# Patient Record
Sex: Female | Born: 1990 | Race: White | Hispanic: No | Marital: Married | Smoking: Never smoker
Health system: Southern US, Community
[De-identification: ages and names within clinical notes are randomized; demographics above are authoritative.]

## PROBLEM LIST (undated history)

## (undated) ENCOUNTER — Inpatient Hospital Stay: Payer: Self-pay

## (undated) DIAGNOSIS — N189 Chronic kidney disease, unspecified: Secondary | ICD-10-CM

## (undated) DIAGNOSIS — F419 Anxiety disorder, unspecified: Secondary | ICD-10-CM

## (undated) DIAGNOSIS — F32A Depression, unspecified: Secondary | ICD-10-CM

## (undated) HISTORY — DX: Anxiety disorder, unspecified: F41.9

## (undated) HISTORY — DX: Depression, unspecified: F32.A

---
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
## 2009-01-01 ENCOUNTER — Ambulatory Visit: Payer: Self-pay | Admitting: Obstetrics and Gynecology

## 2009-01-16 ENCOUNTER — Ambulatory Visit: Payer: Self-pay | Admitting: Family Medicine

## 2009-01-29 ENCOUNTER — Ambulatory Visit: Payer: Self-pay | Admitting: Family Medicine

## 2009-01-29 LAB — CONVERTED CEMR LAB
Hemoglobin: 11.3 g/dL — ABNORMAL LOW (ref 12.0–15.0)
RBC: 3.87 M/uL (ref 3.87–5.11)
RDW: 12.9 % (ref 11.5–15.5)
WBC: 12.6 10*3/uL — ABNORMAL HIGH (ref 4.0–10.5)

## 2009-01-30 ENCOUNTER — Inpatient Hospital Stay (HOSPITAL_COMMUNITY): Admission: AD | Admit: 2009-01-30 | Discharge: 2009-01-30 | Payer: Self-pay | Admitting: Obstetrics & Gynecology

## 2009-02-12 ENCOUNTER — Ambulatory Visit: Payer: Self-pay | Admitting: Obstetrics and Gynecology

## 2009-03-05 ENCOUNTER — Ambulatory Visit: Payer: Self-pay | Admitting: Obstetrics & Gynecology

## 2009-03-20 ENCOUNTER — Ambulatory Visit: Payer: Self-pay | Admitting: Obstetrics and Gynecology

## 2009-03-26 ENCOUNTER — Ambulatory Visit: Payer: Self-pay | Admitting: Obstetrics and Gynecology

## 2009-03-26 ENCOUNTER — Encounter: Payer: Self-pay | Admitting: Family Medicine

## 2009-03-26 LAB — CONVERTED CEMR LAB: Chlamydia, DNA Probe: NEGATIVE

## 2009-03-27 ENCOUNTER — Encounter: Payer: Self-pay | Admitting: Family Medicine

## 2009-04-02 ENCOUNTER — Ambulatory Visit: Payer: Self-pay | Admitting: Obstetrics & Gynecology

## 2009-04-09 ENCOUNTER — Ambulatory Visit: Payer: Self-pay | Admitting: Obstetrics and Gynecology

## 2009-04-15 ENCOUNTER — Inpatient Hospital Stay (HOSPITAL_COMMUNITY): Admission: AD | Admit: 2009-04-15 | Discharge: 2009-04-15 | Payer: Self-pay | Admitting: Obstetrics & Gynecology

## 2009-04-15 ENCOUNTER — Ambulatory Visit: Payer: Self-pay | Admitting: Advanced Practice Midwife

## 2009-04-16 ENCOUNTER — Ambulatory Visit: Payer: Self-pay | Admitting: Obstetrics and Gynecology

## 2009-04-22 ENCOUNTER — Encounter: Payer: Self-pay | Admitting: Family Medicine

## 2009-04-22 ENCOUNTER — Ambulatory Visit: Payer: Self-pay | Admitting: Obstetrics & Gynecology

## 2009-04-22 LAB — CONVERTED CEMR LAB
ALT: 17 units/L (ref 0–35)
Bilirubin, Direct: 0.1 mg/dL (ref 0.0–0.3)

## 2009-04-23 ENCOUNTER — Ambulatory Visit: Payer: Self-pay | Admitting: Obstetrics and Gynecology

## 2009-04-24 ENCOUNTER — Inpatient Hospital Stay (HOSPITAL_COMMUNITY): Admission: AD | Admit: 2009-04-24 | Discharge: 2009-04-27 | Payer: Self-pay | Admitting: Obstetrics & Gynecology

## 2009-04-24 ENCOUNTER — Ambulatory Visit: Payer: Self-pay | Admitting: Advanced Practice Midwife

## 2009-06-09 ENCOUNTER — Encounter: Payer: Self-pay | Admitting: Obstetrics and Gynecology

## 2009-06-09 ENCOUNTER — Other Ambulatory Visit: Admission: RE | Admit: 2009-06-09 | Discharge: 2009-06-09 | Payer: Self-pay | Admitting: Obstetrics and Gynecology

## 2009-06-09 ENCOUNTER — Ambulatory Visit: Payer: Self-pay | Admitting: Obstetrics and Gynecology

## 2009-07-21 ENCOUNTER — Ambulatory Visit: Payer: Self-pay | Admitting: Family Medicine

## 2010-03-06 ENCOUNTER — Emergency Department (HOSPITAL_COMMUNITY): Admission: EM | Admit: 2010-03-06 | Discharge: 2010-03-06 | Payer: Self-pay | Admitting: Adult Health

## 2011-01-31 LAB — COMPREHENSIVE METABOLIC PANEL
ALT: 20 U/L (ref 0–35)
Albumin: 2.3 g/dL — ABNORMAL LOW (ref 3.5–5.2)
Alkaline Phosphatase: 304 U/L — ABNORMAL HIGH (ref 39–117)
Potassium: 3.4 mEq/L — ABNORMAL LOW (ref 3.5–5.1)
Sodium: 136 mEq/L (ref 135–145)
Total Protein: 5.7 g/dL — ABNORMAL LOW (ref 6.0–8.3)

## 2011-01-31 LAB — RPR: RPR Ser Ql: NONREACTIVE

## 2011-01-31 LAB — CBC
MCV: 78.4 fL (ref 78.0–100.0)
RBC: 4.06 MIL/uL (ref 3.87–5.11)
WBC: 10.4 10*3/uL (ref 4.0–10.5)

## 2011-02-03 LAB — URINALYSIS, ROUTINE W REFLEX MICROSCOPIC
Bilirubin Urine: NEGATIVE
Glucose, UA: NEGATIVE mg/dL
Ketones, ur: NEGATIVE mg/dL
pH: 7 (ref 5.0–8.0)

## 2011-02-03 LAB — WET PREP, GENITAL

## 2011-02-03 LAB — URINE MICROSCOPIC-ADD ON

## 2011-02-08 LAB — URINALYSIS, ROUTINE W REFLEX MICROSCOPIC
Glucose, UA: NEGATIVE mg/dL
Ketones, ur: NEGATIVE mg/dL
Nitrite: NEGATIVE
Protein, ur: NEGATIVE mg/dL
Specific Gravity, Urine: 1.02 (ref 1.005–1.030)

## 2011-02-08 LAB — WET PREP, GENITAL
Clue Cells Wet Prep HPF POC: NONE SEEN
Trich, Wet Prep: NONE SEEN
Yeast Wet Prep HPF POC: NONE SEEN

## 2011-02-08 LAB — URINE CULTURE

## 2011-02-08 LAB — CBC
Hemoglobin: 12.7 g/dL (ref 12.0–15.0)
MCHC: 34.2 g/dL (ref 30.0–36.0)
MCV: 88 fL (ref 78.0–100.0)
RBC: 4.22 MIL/uL (ref 3.87–5.11)
RDW: 13 % (ref 11.5–15.5)

## 2011-02-08 LAB — URINE MICROSCOPIC-ADD ON

## 2011-02-09 LAB — URINE MICROSCOPIC-ADD ON

## 2011-02-09 LAB — URINALYSIS, ROUTINE W REFLEX MICROSCOPIC
Bilirubin Urine: NEGATIVE
Glucose, UA: NEGATIVE mg/dL
Ketones, ur: 15 mg/dL — AB
Protein, ur: NEGATIVE mg/dL

## 2011-03-09 NOTE — Group Therapy Note (Signed)
NAME:  PINNIX, [place]              ACCOUNT NO.:  [id number]   MEDICAL RECORD NO.:  [id number]          PATIENT TYPE:  POB   LOCATION:  [facility]                   FACILITY:  [facility]   PHYSICIAN:  Argentina Donovan, MD        DATE OF BIRTH:  1991/05/04   DATE OF SERVICE:  06/09/2009                                  CLINIC NOTE   The patient is a 20 year old Caucasian female, gravida 1, para 1-0-0-1,  who delivered by normal spontaneous delivery uneventfully at 8 pounds 2  ounces baby on April 25, 2009, i.e. 6 weeks ago, in for 6-week checkup.  Her main complaint is upper abdominal soreness like pressure, which she  has constantly, wakes up when she goes to bed with it and has this  continually all day and then when it she gets bad, it will radiate into  her back, does not seem to be in the pelvis.  She has no GI symptoms  whatsoever and I am not quite sure what this is.  In addition to that  she had bad acne vulgaris during her pregnancy and it has persisted.  She did get Depo-Provera in the hospital 6 weeks ago and was interested  in a Mirena IUD.  However, I have talked her about that considering that  the progesterone and one of possible side effects is causing acne.  I  think that she should be do better on the estrogen pill such as Yaz.  So  I am going to give her prescription of that.  I have told her that  because of the Depo-Provera, may take longer than several months before  she starts noticing any kind of improvement.   PHYSICAL EXAMINATION:  BREASTS:  Symmetrical, nontender.  No nipple  discharge.  No masses.  No supraclavicular or axillary nodes.  ABDOMEN:  Soft, flat, and nontender.  No masses or organomegaly.  EXTERNAL GENITALIA:  Normal.  BUS within normal limits.  Vagina is  clean, well rugated.  The cervix is clean with a marked ectropion at the  present time, probably not resolve delivery.  The uterus is retroverted  and of normal size, shape, consistency.  The adnexa is  normal.  No  edema.  No varicosities in the extremities.   The patient's postpartum blood pressure is 118/75 and pulse 69.  She is  139 pounds.  She is 5 feet and 4 inches tall.  I going to have the  patient come back in about 4 weeks to reevaluate this abdomen as the  pain is still there probably move on to an MRI and I really I am at a  loss to explain her pain that she is having.   IMPRESSION:  Postpartum examination.  Normal complaint of the upper  abdominal discomfort radiating into the back troubling.   PLAN:  Yaz for contraception.           ______________________________  Argentina Donovan, MD     PR/MEDQ  D:  06/09/2009  T:  06/10/2009  Job:  [id number]

## 2011-03-09 NOTE — Assessment & Plan Note (Signed)
NAME:  Brandi Montgomery, [place]              ACCOUNT NO.:  [id number]   MEDICAL RECORD NO.:  [id number]          PATIENT TYPE:  POB   LOCATION:  [facility]         FACILITY:  [facility]   PHYSICIAN:  Argentina Donovan, MD        DATE OF BIRTH:  1991/08/02   DATE OF SERVICE:  07/21/2009                                  CLINIC NOTE   The patient is an 20 year old, gravida 1, para 1-0-0-1, who was in short  time ago for her postpartum examination following the delivery in April 25, 2009, and wanted to try the birth control pill.  She was trying Yaz.  She has problems remembering them many of the others.  We have counseled  her against because of the severe acne vulgaris that the patient has.  In addition to that, she complained of some epigastric abdominal pain  occasionally, but it is gotten worse over this last month or so and she  has it every day and sometimes just bends her over is mainly in the mid  epigastric and right epigastric area.  Her mother has had a gallbladder  removed.  Her grandmother had 1 removed last week.  She says the pain  comes sometimes last for an hour to and this becomes much more severe,  does not seem to related to meals, eating, or if she can remember fat  intake.   On examination, her abdomen is soft, flat, nontender.  No masses or  organomegaly, although she does state that she is little uncomfortable  with push in the upper right quadrant, I cannot call as a real positive  Murphy sign.  In addition, after discussing the alternative  contraception as she had previously used NuvaRing with some success, we  are going to order that for her and she is going to start on that.  Her  Pap smear recently was normal.  We are going to get an ultrasound of her  gallbladder.  Should it be positive, we will refer her to a general  surgeon.  If it is negative, I think she needs a gastrointestinal  workup, so we will refer to gastroenterologist.   IMPRESSION:  Intermittent, but  severe epigastric and right upper  quadrant pain and contraception consultation.           ______________________________  Argentina Donovan, MD     PR/MEDQ  D:  07/21/2009  T:  07/22/2009  Job:  [id number]

## 2011-03-12 NOTE — Assessment & Plan Note (Signed)
[facility]                             [facility] OFFICE NOTE   NAME:PINNIX, [place] G                     MRN:          [id number]  DATE:08/19/2006                            DOB:          07-11-1991    [place] is a 20 year old white female who comes unaccompanied to establish  with the practice.  She indicates that she has had a sore throat with no  known exposure to strep.  She does have a history of bronchitis.  She has  had no fever or chills and has not missed school.   She also indicates at the end of the visit that she wanted to have a sports  physical completed.   She previously has been seen at [facility].   CURRENT MEDICATIONS:  Something for cramps - she does not know the name of  it, and a NuvaRing.   ALLERGIES:  CODEINE causes rash and shortness of breath.   PAST MEDICAL HISTORY:  Indicates she has frequent urinary tract infections,  has had chicken pox, no measles or mumps.  She has asthma with no recent  flare since the age of 66.  There is a family history of clotting problems  (thrombus).  She has had a workup which was negative.   SOCIAL HISTORY:  She is in the 10th grade at [employer].  She lives with her mom.  She indicates that she wants to be a fashion  designer.  She is single, has no children, and is a Biochemist, clinical.  She has  never smoked and does not use alcohol.   REVIEW OF SYSTEMS:  Basically negative including ENT, cardiovascular, GI.  She does indicate a history of headaches, especially recently, and asthma as  previously discussed.  She had HIV testing in 2006 which was negative.  GU:  She has no history of renal stones but does have a history of urinary tract  infections.  GYNECOLOGIC:  She is a nullipara.  Her last Pap smear was in  2006 by Marlinda Mike, C.N.M.  MUSCULOSKELETAL:  She has had fractures of her left ankle requiring surgery,  fracture of the left arm which was  casted, and a right ankle twice which  have both been casted.  She does have some occasional pain in her left  ankle.  She indicates allergic rhinitis in the spring and uses Claritin.   FAMILY HISTORY:  Father is living at age 7 with no known medical problems.  Mother is living at age 54.  She has had two strokes, has blood clotting  problems and hypertension.  She has one brother living and well.   When questioned regarding the extended family, I find that a maternal aunt  had a myocardial infarction about age 65.  There are blood clotting problems  on the mother's side of the family but no strokes.  There is hypertension on  the mother's side of the family.  Maternal grandmother has diabetes as well  as breast cancer.  To her knowledge, there is no ovarian, uterine, or  prostate cancer in the family.  A maternal uncle has died of colon cancer.  There is depression in a maternal great uncle.  To her knowledge, there is  no drug or alcohol abuse in the family.   IMMUNIZATION RECORD:  Her last tetanus was in 2006 and she has had the  hepatitis B vaccine.   PHYSICAL EXAMINATION:  VITAL SIGNS:  Blood pressure 96/67, temperature 97.9,  pulse is 72, weight is 115, height 5 feet 3-and-a-quarter inches.  GENERAL:  Well-developed, thin white female in no acute distress.  HEENT:  TMs retracted bilaterally with fluid present.  Mucosa is  erythematous and edematous.  Posterior pharynx slightly injected.  Facial  sinuses are not tender.  She has no lymphadenopathy in the neck.  CHEST:  Clear throughout.  No rales, rhonchi, or wheezes.  She has a slight  moist cough.  HEART:  Rate and rhythm regular without murmurs, gallops or rubs.  No  carotid bruits, no pretibial edema.  MUSCULOSKELETAL:  Muscle mass is equal throughout upper and lower  extremities.  Strength is 5/5 upper and lower extremities.  Spine is  straight without evidence of scoliosis.  She has full range of motion  throughout.   SKIN:  Without obvious lesions in the exposed areas.  PSYCHIATRIC:  Oriented x3.  Verbalizes easily, is quite pleasant.   ASSESSMENT:  1. Pharyngitis/upper respiratory infection.  Plan:  Will start her on      Clarinex 5 mg one daily, have given her samples as well as      prescription.  Will see her back if not improved.  2. Sports physical.  She came without a form.  This will need to be      completed at her earliest convenience.  She is up-to-date with her      tetanus and is aware of Gardasil and plans to get that.  We discussed      that she could either get it here or through her gynecologist.      Atha Starks. Bean, FNP  Electronically Signed     ______________________________  Arta Silence, MD   BDB/MedQ  DD: 08/26/2006  DT: 08/26/2006  Job #: [id number]

## 2011-03-18 ENCOUNTER — Emergency Department: Payer: Self-pay | Admitting: Emergency Medicine

## 2011-07-26 LAB — POCT URINALYSIS DIP (DEVICE)
Bilirubin Urine: NEGATIVE
Nitrite: NEGATIVE
Operator id: 235561
pH: 6

## 2011-07-26 LAB — POCT RAPID STREP A: Streptococcus, Group A Screen (Direct): NEGATIVE

## 2011-08-06 LAB — POCT URINALYSIS DIP (DEVICE)
Bilirubin Urine: NEGATIVE
Nitrite: NEGATIVE
Protein, ur: NEGATIVE
pH: 7.5

## 2011-11-11 ENCOUNTER — Encounter (HOSPITAL_COMMUNITY): Payer: Self-pay

## 2011-11-11 ENCOUNTER — Emergency Department (HOSPITAL_COMMUNITY)
Admission: EM | Admit: 2011-11-11 | Discharge: 2011-11-12 | Disposition: A | Discharge status: Home / Self Care | Payer: Self-pay | Attending: Emergency Medicine | Admitting: Emergency Medicine

## 2011-11-11 ENCOUNTER — Encounter (HOSPITAL_COMMUNITY): Payer: Self-pay | Admitting: Emergency Medicine

## 2011-11-11 ENCOUNTER — Emergency Department (INDEPENDENT_AMBULATORY_CARE_PROVIDER_SITE_OTHER)
Admission: EM | Admit: 2011-11-11 | Discharge: 2011-11-11 | Disposition: A | Discharge status: Nursing Home (Includes ICF) | Payer: Self-pay | Source: Home / Self Care | Attending: Emergency Medicine | Admitting: Emergency Medicine

## 2011-11-11 DIAGNOSIS — N10 Acute tubulo-interstitial nephritis: Secondary | ICD-10-CM

## 2011-11-11 DIAGNOSIS — O98919 Unspecified maternal infectious and parasitic disease complicating pregnancy, unspecified trimester: Secondary | ICD-10-CM

## 2011-11-11 DIAGNOSIS — N12 Tubulo-interstitial nephritis, not specified as acute or chronic: Secondary | ICD-10-CM | POA: Insufficient documentation

## 2011-11-11 DIAGNOSIS — R109 Unspecified abdominal pain: Secondary | ICD-10-CM | POA: Insufficient documentation

## 2011-11-11 DIAGNOSIS — R350 Frequency of micturition: Secondary | ICD-10-CM | POA: Insufficient documentation

## 2011-11-11 DIAGNOSIS — O2390 Unspecified genitourinary tract infection in pregnancy, unspecified trimester: Secondary | ICD-10-CM | POA: Insufficient documentation

## 2011-11-11 DIAGNOSIS — R42 Dizziness and giddiness: Secondary | ICD-10-CM | POA: Insufficient documentation

## 2011-11-11 DIAGNOSIS — IMO0002 Reserved for concepts with insufficient information to code with codable children: Secondary | ICD-10-CM

## 2011-11-11 DIAGNOSIS — R112 Nausea with vomiting, unspecified: Secondary | ICD-10-CM | POA: Insufficient documentation

## 2011-11-11 DIAGNOSIS — O2690 Pregnancy related conditions, unspecified, unspecified trimester: Secondary | ICD-10-CM

## 2011-11-11 DIAGNOSIS — N200 Calculus of kidney: Secondary | ICD-10-CM

## 2011-11-11 LAB — URINALYSIS, ROUTINE W REFLEX MICROSCOPIC
Protein, ur: 30 mg/dL — AB
Urobilinogen, UA: 1 mg/dL (ref 0.0–1.0)

## 2011-11-11 LAB — POCT URINALYSIS DIP (DEVICE)
Glucose, UA: NEGATIVE mg/dL
Nitrite: NEGATIVE
Urobilinogen, UA: 2 mg/dL — ABNORMAL HIGH (ref 0.0–1.0)
pH: 6.5 (ref 5.0–8.0)

## 2011-11-11 LAB — URINE MICROSCOPIC-ADD ON

## 2011-11-11 LAB — COMPREHENSIVE METABOLIC PANEL
BUN: 6 mg/dL (ref 6–23)
CO2: 19 mEq/L (ref 19–32)
Chloride: 103 mEq/L (ref 96–112)
Creatinine, Ser: 0.57 mg/dL (ref 0.50–1.10)
GFR calc non Af Amer: >90 mL/min (ref >90)
Glucose, Bld: 105 mg/dL — ABNORMAL HIGH (ref 70–99)
Total Bilirubin: 0.5 mg/dL (ref 0.3–1.2)

## 2011-11-11 LAB — DIFFERENTIAL
Lymphocytes Relative: 4 % — ABNORMAL LOW (ref 12–46)
Monocytes Absolute: 1.2 10*3/uL — ABNORMAL HIGH (ref 0.1–1.0)
Monocytes Relative: 8 % (ref 3–12)
Neutro Abs: 12.9 10*3/uL — ABNORMAL HIGH (ref 1.7–7.7)

## 2011-11-11 LAB — URINE CULTURE: Colony Count: >=100000

## 2011-11-11 LAB — CBC
HCT: 36.8 % (ref 36.0–46.0)
Hemoglobin: 13.1 g/dL (ref 12.0–15.0)
WBC: 14.7 10*3/uL — ABNORMAL HIGH (ref 4.0–10.5)

## 2011-11-11 LAB — LIPASE, BLOOD: Lipase: 22 U/L (ref 11–59)

## 2011-11-11 MED ORDER — SODIUM CHLORIDE 0.9 % IV BOLUS (SEPSIS)
1000.0000 mL | Freq: Once | INTRAVENOUS | Status: AC
  Administered 2011-11-11: 1000 mL via INTRAVENOUS

## 2011-11-11 MED ORDER — CEPHALEXIN 500 MG PO CAPS: 500.0000 mg | ORAL_CAPSULE | Freq: Three times a day (TID) | ORAL | Status: AC

## 2011-11-11 MED ORDER — DEXTROSE 5 % IV SOLN
1.0000 g | INTRAVENOUS | Status: DC
  Administered 2011-11-11: 1 g via INTRAVENOUS
  Filled 2011-11-11: qty 10

## 2011-11-11 MED ORDER — METOCLOPRAMIDE HCL 10 MG PO TABS: 10.0000 mg | ORAL_TABLET | Freq: Four times a day (QID) | ORAL | Status: AC

## 2011-11-11 MED ORDER — HYDROCODONE-ACETAMINOPHEN 5-325 MG PO TABS: 1.0000 | ORAL_TABLET | Freq: Four times a day (QID) | ORAL | Status: AC | PRN

## 2011-11-11 MED ORDER — ONDANSETRON HCL 4 MG/2ML IJ SOLN
4.0000 mg | Freq: Once | INTRAMUSCULAR | Status: AC
  Administered 2011-11-11: 4 mg via INTRAVENOUS

## 2011-11-11 MED ORDER — ONDANSETRON HCL 4 MG/2ML IJ SOLN
INTRAMUSCULAR | Status: AC
  Filled 2011-11-11: qty 2

## 2011-11-11 MED ORDER — MORPHINE SULFATE 4 MG/ML IJ SOLN
4.0000 mg | Freq: Once | INTRAMUSCULAR | Status: AC
  Administered 2011-11-11: 4 mg via INTRAVENOUS
  Filled 2011-11-11: qty 1

## 2011-11-11 MED ORDER — PRENATAL COMPLETE 14-0.4 MG PO TABS: 1.0000 | ORAL_TABLET | Freq: Every day | ORAL | Status: DC

## 2011-11-11 NOTE — ED Notes (Signed)
Pain in right lower back, onset 11/09/2010.  Urinary frequency -denies burning with urination.  Last kidney stone was in high school.  This pain feels worse.

## 2011-11-11 NOTE — ED Notes (Signed)
Patient approached nursing staff with complaints of seeing black spots and feeling like she would pass out.  Escorted back to room, continues with complaints

## 2011-11-11 NOTE — ED Provider Notes (Signed)
History     CSN: [id number]  Arrival date & time 11/11/11  1422   First MD Initiated Contact with Patient 11/11/11 1506      Chief Complaint  Patient presents with  . Back Pain    (Consider location/radiation/quality/duration/timing/severity/associated sxs/prior treatment) HPI Comments: Ms. Brandi Montgomery with sudden onset of right flank pain without radiation. The pain is worse if she sits up and better if she lies flat. It's otherwise severe and unremitting. It's been associated with nausea and vomiting all by mouth intake, she felt lightheaded, and dizzy. She's also had chills and sweats and urinary frequency. She denies any dysuria or hematuria. She has a history of kidney stones in 2009 which he passed spontaneously. She did see a urologist at that time. Denies any fever, diarrhea, or abdominal pain. Her last menstrual period was January 7. She denies any pregnancy symptoms.  Patient is a 21 y.o. female presenting with back pain.  Back Pain  Pertinent negatives include no fever, no abdominal pain, no dysuria and no pelvic pain.    Past Medical History  Diagnosis Date  . Kidney stone     History reviewed. No pertinent past surgical history.  History reviewed. No pertinent family history.  History  Substance Use Topics  . Smoking status: Never Smoker   . Smokeless tobacco: Not on file  . Alcohol Use: Yes    OB History    Grav Para Term Preterm Abortions TAB SAB Ect Mult Living                  Review of Systems  Constitutional: Positive for chills, diaphoresis and fatigue. Negative for fever.  Gastrointestinal: Negative for nausea, vomiting and abdominal pain.  Genitourinary: Positive for frequency and flank pain. Negative for dysuria, urgency, hematuria, vaginal bleeding, vaginal discharge, vaginal pain and pelvic pain.  Musculoskeletal: Positive for back pain.    Allergies  Codeine  Home Medications  No current outpatient prescriptions on file.  BP 114/78  Pulse  111  Temp(Src) 99.4 F (37.4 C) (Oral)  Resp 20  SpO2 98%  LMP 10/27/2011  Physical Exam  Nursing note and vitals reviewed. Constitutional: She appears well-developed and well-nourished. No distress.  Cardiovascular: Normal rate and regular rhythm.  Exam reveals no gallop and no friction rub.   No murmur heard. Pulmonary/Chest: Effort normal. No respiratory distress. She has no wheezes. She has no rales.  Abdominal: Soft. Bowel sounds are normal. She exhibits no distension and no mass. There is no hepatosplenomegaly. There is tenderness. There is no rebound, no guarding and no CVA tenderness.       She has severe right CVA tenderness to percussion. Abdomen is soft and flat. She has mild tenderness to palpation in the right flank without guarding or rebound. No organomegaly or mass. Bowel sounds are normal.  Skin: She is not diaphoretic.    ED Course  Procedures (including critical care time)  Results for orders placed during the hospital encounter of 11/11/11  POCT URINALYSIS DIP (DEVICE)      Component Value Range   Glucose, UA NEGATIVE  NEGATIVE (mg/dL)   Bilirubin Urine NEGATIVE  NEGATIVE    Ketones, ur 15 (*) NEGATIVE (mg/dL)   Specific Gravity, Urine 1.020  1.005 - 1.030    Hgb urine dipstick LARGE (*) NEGATIVE    pH 6.5  5.0 - 8.0    Protein, ur 100 (*) NEGATIVE (mg/dL)   Urobilinogen, UA 2.0 (*) 0.0 - 1.0 (mg/dL)   Nitrite NEGATIVE  NEGATIVE    Leukocytes, UA LARGE (*) NEGATIVE   POCT PREGNANCY, URINE      Component Value Range   Preg Test, Ur POSITIVE       Labs Reviewed  POCT URINALYSIS DIP (DEVICE) - Abnormal; Notable for the following:    Ketones, ur 15 (*)    Hgb urine dipstick LARGE (*)    Protein, ur 100 (*)    Urobilinogen, UA 2.0 (*)    Leukocytes, UA LARGE (*) Biochemical Testing Only. Please order routine urinalysis from main lab if confirmatory testing is needed.   All other components within normal limits  POCT PREGNANCY, URINE  POCT URINALYSIS  DIPSTICK  POCT PREGNANCY, URINE   No results found.   1. Acute pyelonephritis   2. Kidney stone   3. Pregnancy and infectious disease       MDM  She has probable acute pyelonephritis and possibly a kidney stone in the setting early pregnancy which is a very high risk and, therefore we will send her to the emergency department by shuttle.        Roque Lias, MD 11/11/11 1640

## 2011-11-11 NOTE — ED Provider Notes (Signed)
History     CSN: [id number]  Arrival date & time 11/11/11  1712   First MD Initiated Contact with Patient 11/11/11 1933      Chief Complaint  Patient presents with  . Flank Pain    (Consider location/radiation/quality/duration/timing/severity/associated sxs/prior treatment) HPI Patient presents to the emergency room with flank pain for the last 2 days. He has had trouble with urinary frequency.  Patient has mentioned in worsens with certain movement.  She has had some nausea vomiting. She's also had some lightheadedness and chills. Patient has history of kidney stones in the past. Denies any abdominal pain. She has not had any vaginal bleeding. Her last period was in the first week of January. The patient's pain is moderate to severe Past Medical History  Diagnosis Date  . Kidney stone   . Kidney stone     History reviewed. No pertinent past surgical history.  No family history on file.  History  Substance Use Topics  . Smoking status: Never Smoker   . Smokeless tobacco: Not on file  . Alcohol Use: Yes    OB History    Grav Para Term Preterm Abortions TAB SAB Ect Mult Living                  Review of Systems  Respiratory: Negative for cough.   Genitourinary: Positive for frequency and flank pain.  All other systems reviewed and are negative.    Allergies  Codeine  Home Medications  No current outpatient prescriptions on file.  BP 99/52  Pulse 106  Temp(Src) 97.9 F (36.6 C) (Oral)  Resp 16  SpO2 100%  LMP 10/27/2011  Physical Exam  Nursing note and vitals reviewed. Constitutional: She appears well-developed and well-nourished. No distress.  HENT:  Head: Normocephalic and atraumatic.  Right Ear: External ear normal.  Left Ear: External ear normal.  Eyes: Conjunctivae are normal. Right eye exhibits no discharge. Left eye exhibits no discharge. No scleral icterus.  Neck: Neck supple. No tracheal deviation present.  Cardiovascular: Normal rate,  regular rhythm and intact distal pulses.   Pulmonary/Chest: Effort normal and breath sounds normal. No stridor. No respiratory distress. She has no wheezes. She has no rales.  Abdominal: Soft. Bowel sounds are normal. She exhibits no distension and no mass. There is no tenderness. There is CVA tenderness (right-sided). There is no rebound and no guarding.  Musculoskeletal: She exhibits no edema and no tenderness.  Neurological: She is alert. She has normal strength. No sensory deficit. Cranial nerve deficit:  no gross defecits noted. She exhibits normal muscle tone. She displays no seizure activity. Coordination normal.  Skin: Skin is warm and dry. No rash noted.  Psychiatric: She has a normal mood and affect.    ED Course  Procedures (including critical care time)  Medications  cefTRIAXone (ROCEPHIN) 1 g in dextrose 5 % 50 mL IVPB (1 g Intravenous Given 11/11/11 2305)  morphine 4 MG/ML injection 4 mg (4 mg Intravenous Given 11/11/11 2046)  ondansetron (ZOFRAN) injection 4 mg (4 mg Intravenous Given 11/11/11 2046)  sodium chloride 0.9 % bolus 1,000 mL (1000 mL Intravenous Given 11/11/11 2214)  morphine 4 MG/ML injection 4 mg (4 mg Intravenous Given 11/11/11 2214)    Labs Reviewed  CBC - Abnormal; Notable for the following:    WBC 14.7 (*)    All other components within normal limits  DIFFERENTIAL - Abnormal; Notable for the following:    Neutrophils Relative 88 (*)    Neutro Abs  12.9 (*)    Lymphocytes Relative 4 (*)    Lymphs Abs 0.6 (*)    Monocytes Absolute 1.2 (*)    All other components within normal limits  COMPREHENSIVE METABOLIC PANEL - Abnormal; Notable for the following:    Glucose, Bld 105 (*)    Albumin 3.3 (*)    All other components within normal limits  URINALYSIS, ROUTINE W REFLEX MICROSCOPIC - Abnormal; Notable for the following:    APPearance CLOUDY (*)    Hgb urine dipstick MODERATE (*)    Ketones, ur >80 (*)    Protein, ur 30 (*)    Nitrite POSITIVE (*)     Leukocytes, UA LARGE (*)    All other components within normal limits  URINE MICROSCOPIC-ADD ON - Abnormal; Notable for the following:    Squamous Epithelial / LPF MANY (*)    Bacteria, UA MANY (*)    All other components within normal limits  LIPASE, BLOOD  URINE CULTURE  HCG, QUANTITATIVE, PREGNANCY   No results found.   Diagnosis: Pyelonephritis, pregnancy   MDM  Patient appears to have pyelonephritis. There does not appear to be any evidence to suggest an acute complication associated with her pregnancy.  Patient has not had any vaginal bleeding or abdominal pain. I doubt appendicitis based on her exam. Her urinalysis is consistent with a urinary tract infection and likely pyelonephritis considering her leukocytosis flank pain and the nausea and vomiting. Patient is improved with treatment here in the emergency department. I spoke with Dr.Lavoie feels it is reasonable to have her followup closely as an outpatient. Patient has been given a dose of IV Rocephin.        Celene Kras, MD 11/11/11 [phone]

## 2011-11-11 NOTE — ED Notes (Signed)
C/o nausea and vomiting, unable to hold solids or liquids down. no diarhea.  Patient reports chills during the night.

## 2011-11-11 NOTE — ED Notes (Signed)
PT sleeping

## 2011-11-11 NOTE — ED Notes (Signed)
Pt presents from urgent care with 2 day h/o R flank pain.  Pt diagnosed with kidney infection, referred here.

## 2011-11-14 NOTE — ED Notes (Signed)
Pt treated per protocol MD; Sensitive to same

## 2013-06-10 ENCOUNTER — Encounter (HOSPITAL_COMMUNITY): Payer: Self-pay | Admitting: *Deleted

## 2013-06-10 ENCOUNTER — Emergency Department (INDEPENDENT_AMBULATORY_CARE_PROVIDER_SITE_OTHER)
Admission: EM | Admit: 2013-06-10 | Discharge: 2013-06-10 | Disposition: A | Discharge status: Home / Self Care | Payer: Self-pay | Source: Home / Self Care

## 2013-06-10 DIAGNOSIS — N390 Urinary tract infection, site not specified: Secondary | ICD-10-CM

## 2013-06-10 LAB — POCT URINALYSIS DIP (DEVICE)
Ketones, ur: 15 mg/dL — AB
Nitrite: POSITIVE — AB
Specific Gravity, Urine: <=1.005 (ref 1.005–1.030)
pH: 5 (ref 5.0–8.0)

## 2013-06-10 MED ORDER — CEPHALEXIN 500 MG PO CAPS: 500.0000 mg | ORAL_CAPSULE | Freq: Three times a day (TID) | ORAL | Status: DC

## 2013-06-10 NOTE — ED Notes (Signed)
Started with urinary urgency 3 days ago; was drinking cranberry juice and taking AZO; woke up this morning with back pain.  Denies fevers.

## 2013-06-10 NOTE — ED Provider Notes (Signed)
Brandi Montgomery is a 22 y.o. female who presents to Urgent Care today for dysuria, urinary frequency and urgency occurring for the last several days. This morning she developed back pain and vomiting. She denies any fevers or chills. The symptoms are consistent with prior episodes of UTI. The pain is mild to moderate and does not radiate. She's had normal bowel movements. She has a history of kidney stones times one however the symptoms are not consistent with prior kidney stone. She has tried AZO which has helped a bit. She feels well otherwise.   PMH reviewed. One episode of kidney stone in the past. Urine culture from January 2013 shows pan sensitive Escherichia coli History  Substance Use Topics  . Smoking status: Never Smoker   . Smokeless tobacco: Not on file  . Alcohol Use: Yes     Comment: occasional   ROS as above Medications reviewed. No current facility-administered medications for this encounter.   Current Outpatient Prescriptions  Medication Sig Dispense Refill  . fish oil-omega-3 fatty acids 1000 MG capsule Take 2 g by mouth daily.      . cephALEXin (KEFLEX) 500 MG capsule Take 1 capsule (500 mg total) by mouth 3 (three) times daily.  21 capsule  0    Exam:  BP 123/88  Pulse 88  Temp(Src) 98.3 F (36.8 C) (Oral)  Resp 16  SpO2 98%  LMP 06/02/2013  Breastfeeding? Unknown Gen: Well NAD HEENT: EOMI,  MMM Lungs: CTABL Nl WOB Heart: RRR no MRG Abd: NABS, NT, ND, no CV angle tenderness to percussion Exts: Non edematous BL  LE, warm and well perfused.   Results for orders placed during the hospital encounter of 06/10/13 (from the past 24 hour(s))  POCT URINALYSIS DIP (DEVICE)     Status: Abnormal   Collection Time    06/10/13 12:39 PM      Result Value Range   Glucose, UA 250 (*) NEGATIVE mg/dL   Bilirubin Urine MODERATE (*) NEGATIVE   Ketones, ur 15 (*) NEGATIVE mg/dL   Specific Gravity, Urine <=1.005  1.005 - 1.030   Hgb urine dipstick TRACE (*) NEGATIVE   pH  5.0  5.0 - 8.0   Protein, ur >=300 (*) NEGATIVE mg/dL   Urobilinogen, UA >=7.8  0.0 - 1.0 mg/dL   Nitrite POSITIVE (*) NEGATIVE   Leukocytes, UA LARGE (*) NEGATIVE  POCT PREGNANCY, URINE     Status: None   Collection Time    06/10/13 12:40 PM      Result Value Range   Preg Test, Ur NEGATIVE  NEGATIVE   No results found.  Assessment and Plan: 22 y.o. female with UTI.  Plan to treat empirically with Keflex.  Followup as needed Handout provided Discussed warning signs or symptoms. Please see discharge instructions. Patient expresses understanding.      Rodolph Bong, MD 06/10/13 [phone]

## 2013-09-09 ENCOUNTER — Encounter (HOSPITAL_COMMUNITY): Payer: Self-pay | Admitting: Emergency Medicine

## 2013-09-09 ENCOUNTER — Emergency Department (INDEPENDENT_AMBULATORY_CARE_PROVIDER_SITE_OTHER)
Admission: EM | Admit: 2013-09-09 | Discharge: 2013-09-09 | Disposition: A | Discharge status: Home / Self Care | Payer: Self-pay | Source: Home / Self Care | Attending: Emergency Medicine | Admitting: Emergency Medicine

## 2013-09-09 DIAGNOSIS — J039 Acute tonsillitis, unspecified: Secondary | ICD-10-CM

## 2013-09-09 MED ORDER — AMOXICILLIN 500 MG PO CAPS: 500.0000 mg | ORAL_CAPSULE | Freq: Three times a day (TID) | ORAL | Status: DC

## 2013-09-09 MED ORDER — HYDROCODONE-ACETAMINOPHEN 5-325 MG PO TABS: ORAL_TABLET | ORAL | Status: DC

## 2013-09-09 NOTE — ED Provider Notes (Signed)
Chief Complaint:   Chief Complaint  Patient presents with  . Sore Throat    History of Present Illness:   Brandi Montgomery is a 22 year old female who's had a two-day history of severe sore throat, pain on swallowing, chills, temperature 100.4, sweats, and swollen, tender glands. She denies any headache, nasal congestion, rhinorrhea, cough, or GI symptoms. It hurts to open and close her mouth, but she has a full range of motion of the jaw joint. She has no difficulty swallowing food or liquids, handling her secretions, or breathing.  Review of Systems:  Other than as noted above, the patient denies any of the following symptoms. Systemic:  No fever, chills, sweats, fatigue, myalgias, headache, or anorexia. Eye:  No redness, pain or drainage. ENT:  No earache, ear congestion, nasal congestion, sneezing, rhinorrhea, sinus pressure, sinus pain, or post nasal drip. Lungs:  No cough, sputum production, wheezing, shortness of breath, or chest pain. GI:  No abdominal pain, nausea, vomiting, or diarrhea. Skin:  No rash or itching.  PMFSH:  Past medical history, family history, social history, meds, allergies, and nurse's notes were reviewed.  There is no known exposure to strep or mono.  No prior history of step or mono.  The patient denies use of tobacco.   Physical Exam:   Vital signs:  BP 105/64  Pulse 80  Temp(Src) 98.2 F (36.8 C) (Oral)  Resp 16  SpO2 100%  LMP 09/02/2013 General:  Alert, in no distress. Eye:  No conjunctival injection or drainage. Lids were normal. ENT:  TMs and canals were normal, without erythema or inflammation.  Nasal mucosa was clear and uncongested, without drainage.  Mucous membranes were moist.  Exam of pharynx reveals tonsils to be markedly enlarged right more so than left. The uvula is midline, she has minimal bulging of the right anterior tonsillar pillar.  There were no oral ulcerations or lesions.  Neck:  Supple, no adenopathy, tenderness or mass. Lungs:  No  respiratory distress.  Lungs were clear to auscultation, without wheezes, rales or rhonchi.  Breath sounds were clear and equal bilaterally.  Heart:  Regular rhythm, without gallops, murmers or rubs. Skin:  Clear, warm, and dry, without rash or lesions.  Labs:   Results for orders placed during the hospital encounter of 09/09/13  POCT RAPID STREP A ([facility] ONLY)      Result Value Range   Streptococcus, Group A Screen (Direct) NEGATIVE  NEGATIVE  POCT INFECTIOUS MONO SCREEN      Result Value Range   Mono Screen NEGATIVE  NEGATIVE   Assessment:  The encounter diagnosis was Tonsillitis.  There is no evidence of a peritonsillar abscess.  I think she has some peritonsillar cellulitis. I called Dr. Suzanna Obey, the ear nose and throat specialist on call for today and discuss this with him. He is willing to see her in the office first thing tomorrow morning. In the meantime will treat with amoxicillin and she was given hydrocodone for pain.  Plan:   1.  Meds:  The following meds were prescribed:   Discharge Medication List as of 09/09/2013  2:57 PM    START taking these medications   Details  amoxicillin (AMOXIL) 500 MG capsule Take 1 capsule (500 mg total) by mouth 3 (three) times daily., Starting 09/09/2013, Until Discontinued, Normal    HYDROcodone-acetaminophen (NORCO/VICODIN) 5-325 MG per tablet 1 to 2 tabs every 4 to 6 hours as needed for pain., Print  2.  Patient Education/Counseling:  The patient was given appropriate handouts, self care instructions, and instructed in symptomatic relief, including hot saline gargles, throat lozenges, infectious precautions, and need to trade out toothbrush.   3.  Follow up:  The patient was told to follow up if no better in 3 to 4 days, if becoming worse in any way, and given some red flag symptoms such as difficulty swallowing or breathing which would prompt immediate return.  Follow up at the emergency department as needed for worsening  symptoms, and with Dr. Jearld Fenton tomorrow morning.     Reuben Likes, MD 09/09/13 2000

## 2013-09-09 NOTE — ED Notes (Signed)
Assessment per Dr. Keller. 

## 2013-09-11 LAB — CULTURE, GROUP A STREP

## 2013-12-04 ENCOUNTER — Ambulatory Visit (INDEPENDENT_AMBULATORY_CARE_PROVIDER_SITE_OTHER): Payer: 59 | Admitting: Family Medicine

## 2013-12-04 ENCOUNTER — Encounter: Payer: Self-pay | Admitting: Family Medicine

## 2013-12-04 VITALS — BP 110/68 | HR 62 | Temp 99.2°F | Resp 16 | Ht 64.0 in | Wt 128.2 lb

## 2013-12-04 DIAGNOSIS — Z113 Encounter for screening for infections with a predominantly sexual mode of transmission: Secondary | ICD-10-CM

## 2013-12-04 DIAGNOSIS — Z13220 Encounter for screening for lipoid disorders: Secondary | ICD-10-CM | POA: Insufficient documentation

## 2013-12-04 DIAGNOSIS — Z0000 Encounter for general adult medical examination without abnormal findings: Secondary | ICD-10-CM | POA: Insufficient documentation

## 2013-12-04 DIAGNOSIS — N760 Acute vaginitis: Secondary | ICD-10-CM

## 2013-12-04 DIAGNOSIS — Z01419 Encounter for gynecological examination (general) (routine) without abnormal findings: Secondary | ICD-10-CM

## 2013-12-04 LAB — POCT UA - MICROSCOPIC ONLY
CASTS, UR, LPF, POC: NEGATIVE
Crystals, Ur, HPF, POC: NEGATIVE
YEAST UA: NEGATIVE

## 2013-12-04 LAB — POCT URINALYSIS DIPSTICK
Bilirubin, UA: NEGATIVE
Glucose, UA: NEGATIVE
Ketones, UA: NEGATIVE
NITRITE UA: NEGATIVE
PH UA: 6.5
PROTEIN UA: NEGATIVE
Spec Grav, UA: 1.02
UROBILINOGEN UA: 0.2

## 2013-12-04 LAB — LIPID PANEL
Cholesterol: 150 mg/dL (ref 0–200)
HDL: 52 mg/dL (ref >39)
LDL Cholesterol: 87 mg/dL (ref 0–99)
TRIGLYCERIDES: 57 mg/dL (ref <150)
Total CHOL/HDL Ratio: 2.9 Ratio
VLDL: 11 mg/dL (ref 0–40)

## 2013-12-04 LAB — COMPLETE METABOLIC PANEL WITH GFR
ALK PHOS: 51 U/L (ref 39–117)
ALT: 13 U/L (ref 0–35)
AST: 15 U/L (ref 0–37)
Albumin: 4.2 g/dL (ref 3.5–5.2)
BILIRUBIN TOTAL: 0.4 mg/dL (ref 0.2–1.2)
BUN: 10 mg/dL (ref 6–23)
CALCIUM: 9.2 mg/dL (ref 8.4–10.5)
CHLORIDE: 106 meq/L (ref 96–112)
CO2: 26 mEq/L (ref 19–32)
CREATININE: 0.75 mg/dL (ref 0.50–1.10)
GFR, Est African American: >89 mL/min
GFR, Est Non African American: >89 mL/min
Glucose, Bld: 81 mg/dL (ref 70–99)
Potassium: 4.4 mEq/L (ref 3.5–5.3)
Sodium: 137 mEq/L (ref 135–145)
Total Protein: 7.1 g/dL (ref 6.0–8.3)

## 2013-12-04 LAB — POCT WET PREP WITH KOH
KOH PREP POC: POSITIVE
Trichomonas, UA: NEGATIVE

## 2013-12-04 LAB — CBC WITH DIFFERENTIAL/PLATELET
Basophils Absolute: 0.1 10*3/uL (ref 0.0–0.1)
Basophils Relative: 1 % (ref 0–1)
Eosinophils Absolute: 0.2 10*3/uL (ref 0.0–0.7)
Eosinophils Relative: 3 % (ref 0–5)
HEMATOCRIT: 40.5 % (ref 36.0–46.0)
Hemoglobin: 13.8 g/dL (ref 12.0–15.0)
LYMPHS ABS: 1.9 10*3/uL (ref 0.7–4.0)
LYMPHS PCT: 31 % (ref 12–46)
MCH: 29.7 pg (ref 26.0–34.0)
MCHC: 34.1 g/dL (ref 30.0–36.0)
MCV: 87.1 fL (ref 78.0–100.0)
MONO ABS: 0.5 10*3/uL (ref 0.1–1.0)
Monocytes Relative: 8 % (ref 3–12)
NEUTROS ABS: 3.4 10*3/uL (ref 1.7–7.7)
Neutrophils Relative %: 57 % (ref 43–77)
Platelets: 208 10*3/uL (ref 150–400)
RBC: 4.65 MIL/uL (ref 3.87–5.11)
RDW: 13.3 % (ref 11.5–15.5)
WBC: 6 10*3/uL (ref 4.0–10.5)

## 2013-12-04 MED ORDER — METRONIDAZOLE 0.75 % VA GEL: 1.0000 | Freq: Two times a day (BID) | VAGINAL | Status: DC

## 2013-12-04 MED ORDER — FLUCONAZOLE 150 MG PO TABS: 150.0000 mg | ORAL_TABLET | Freq: Once | ORAL | Status: DC

## 2013-12-04 NOTE — Progress Notes (Signed)
Subjective:    Patient ID: Brandi Montgomery, female    DOB: 08/15/1991, 23 y.o.   MRN: [id number]  HPI  This 23 y.o. Cauc female is here for CPE/ PAP. Last PAP (negative) >4 years ago. Pt takes no prescription medications and uses condoms for contraception. She has some minor allergy symptoms but is otherwise healthy. She is G1P1; healthy 24 yr-old son born 04/25/2009.  PMHx, Surg Hx, Soc and Fam Hx reviewed.  Review of Systems  HENT: Positive for sinus pressure.   Eyes: Positive for itching.  All other systems reviewed and are negative.       Objective:   Physical Exam  Nursing note and vitals reviewed. Constitutional: She is oriented to person, place, and time. Vital signs are normal. She appears well-developed and well-nourished. No distress.  HENT:  Head: Normocephalic and atraumatic.  Right Ear: Hearing, tympanic membrane, external ear and ear canal normal.  Left Ear: Hearing, tympanic membrane, external ear and ear canal normal.  Nose: Nose normal. No mucosal edema, rhinorrhea, nasal deformity or septal deviation.  Mouth/Throat: Uvula is midline, oropharynx is clear and moist and mucous membranes are normal. No oral lesions. Normal dentition. No dental caries.  Eyes: Conjunctivae, EOM and lids are normal. Pupils are equal, round, and reactive to light. No scleral icterus.  Fundoscopic exam:      The right eye shows no arteriolar narrowing, no AV nicking and no papilledema. The right eye shows red reflex.       The left eye shows no arteriolar narrowing, no AV nicking and no papilledema. The left eye shows red reflex.  Neck: Trachea normal, normal range of motion and full passive range of motion without pain. Neck supple. No spinous process tenderness and no muscular tenderness present. No mass and no thyromegaly present.  Cardiovascular: Normal rate, regular rhythm, S1 normal, S2 normal, normal heart sounds, intact distal pulses and normal pulses.   No extrasystoles are present.  PMI is not displaced.  Exam reveals no gallop and no friction rub.   No murmur heard. Pulmonary/Chest: Effort normal and breath sounds normal. No respiratory distress. Right breast exhibits no inverted nipple, no mass, no nipple discharge, no skin change and no tenderness. Left breast exhibits no inverted nipple, no mass, no nipple discharge, no skin change and no tenderness. Breasts are symmetrical.  Abdominal: Soft. Normal appearance and bowel sounds are normal. She exhibits no distension and no mass. There is no hepatosplenomegaly. There is no tenderness. There is no guarding and no CVA tenderness.  Genitourinary: Rectum normal and uterus normal. There is no rash, tenderness or lesion on the right labia. There is no rash, tenderness or lesion on the left labia. Uterus is not deviated, not enlarged, not fixed and not tender. Cervix exhibits discharge and friability. Cervix exhibits no motion tenderness. Right adnexum displays no mass, no tenderness and no fullness. Left adnexum displays no mass, no tenderness and no fullness. No erythema, tenderness or bleeding around the vagina. No foreign body around the vagina. No signs of injury around the vagina. Vaginal discharge found.  Musculoskeletal: Normal range of motion. She exhibits no edema and no tenderness.  Lymphadenopathy:       Head (right side): No submental, no submandibular, no tonsillar, no posterior auricular and no occipital adenopathy present.       Head (left side): No submental, no submandibular, no tonsillar, no posterior auricular and no occipital adenopathy present.    She has no cervical adenopathy.  She has no axillary adenopathy.       Right: No inguinal and no supraclavicular adenopathy present.       Left: No inguinal and no supraclavicular adenopathy present.  Neurological: She is alert and oriented to person, place, and time. She has normal strength. She displays no atrophy and no tremor. No cranial nerve deficit or sensory  deficit. She exhibits normal muscle tone. Coordination and gait normal.  Skin: Skin is warm, dry and intact. No ecchymosis, no lesion, no petechiae and no rash noted. She is not diaphoretic. No cyanosis or erythema. No pallor. Nails show no clubbing.  Psychiatric: She has a normal mood and affect. Her speech is normal and behavior is normal. Judgment and thought content normal. Cognition and memory are normal.    Results for orders placed in visit on 12/04/13  POCT URINALYSIS DIPSTICK      Result Value Range   Color, UA yellow     Clarity, UA cloudy     Glucose, UA neg     Bilirubin, UA neg     Ketones, UA neg     Spec Grav, UA 1.020     Blood, UA trace-intact     pH, UA 6.5     Protein, UA neg     Urobilinogen, UA 0.2     Nitrite, UA neg     Leukocytes, UA small (1+)    POCT WET PREP WITH KOH      Result Value Range   Trichomonas, UA Negative     Clue Cells Wet Prep HPF POC 3-8     Epithelial Wet Prep HPF POC 12-21     Yeast Wet Prep HPF POC buds     Bacteria Wet Prep HPF POC 3+     RBC Wet Prep HPF POC 0-6     WBC Wet Prep HPF POC 12-24     KOH Prep POC Positive    POCT UA - MICROSCOPIC ONLY      Result Value Range   WBC, Ur, HPF, POC 3-8     RBC, urine, microscopic 0-3     Bacteria, U Microscopic 1+     Mucus, UA small     Epithelial cells, urine per micros 3-7     Crystals, Ur, HPF, POC neg     Casts, Ur, LPF, POC neg     Yeast, UA neg         Assessment & Plan:  Routine general medical examination at a health care facility - Plan: POCT urinalysis dipstick, COMPLETE METABOLIC PANEL WITH GFR, CBC with Differential, Lipid panel, POCT UA - Microscopic Only  Encounter for cervical Pap smear with pelvic exam - Plan: Pap IG, CT/NG w/ reflex HPV when ASC-U, POCT Wet Prep with KOH  Vaginitis and vulvovaginitis, unspecified  Screening examination for venereal disease - Plan: HIV antibody  Advised about Gardisil vaccine which she can receive before age 33; pt to find out  if she has had Tdap vaccine.  Meds ordered this encounter  Medications  . fluconazole (DIFLUCAN) 150 MG tablet    Sig: Take 1 tablet (150 mg total) by mouth once.    Dispense:  1 tablet    Refill:  0  . metroNIDAZOLE (METROGEL VAGINAL) 0.75 % vaginal gel    Sig: Place 1 Applicatorful vaginally 2 (two) times daily.    Dispense:  70 g    Refill:  0

## 2013-12-04 NOTE — Patient Instructions (Addendum)
Keeping You Healthy  Get These Tests 1. Blood Pressure- Have your blood pressure checked once a year by your health care provider.  Normal blood pressure is 120/80. 2. Weight- Have your body mass index (BMI) calculated to screen for obesity.  BMI is measure of body fat based on height and weight.  You can also calculate your own BMI at https://www.west-esparza.com/. 3. Cholesterol- Have your cholesterol checked every 5 years starting at age 24 then yearly starting at age 71. 4. Chlamydia, HIV, and other sexually transmitted diseases- Get screened every year until age 71, then within three months of each new sexual provider. 5. Pap Smear- Every 1-3 years; discuss with your health care provider. 6. Mammogram- Every year starting at age 69  Take these medicines  Calcium with Vitamin D-Your body needs 1200 mg of Calcium each day and [phone] IU of Vitamin D daily.  Your body can only absorb 500 mg of Calcium at a time so Calcium must be taken in 2 or 3 divided doses throughout the day.  Multivitamin with folic acid- Once daily if it is possible for you to become pregnant.  Get these Immunizations  Gardasil-Series of three doses; prevents HPV related illness such as genital warts and cervical cancer.  You will be provided with some information about this vaccine. You have until age 33 to get all 3 doses.  Menactra-Single dose; prevents meningitis.  Tetanus shot- Every 10 years. You need to find out if you have received Tdap vaccine which should have been given durinig your last pregnancy if you had not received it within the last 8-10 years.  Flu shot-Every year.  Take these steps 1. Do not smoke-Your healthcare provider can help you quit.  For tips on how to quit go to www.smokefree.gov or call 1-800 QUITNOW. 2. Be physically active- Exercise 5 days a week for at least 30 minutes.  If you are not already physically active, start slow and gradually work up to 30 minutes of moderate physical  activity.  Examples of moderate activity include walking briskly, dancing, swimming, bicycling, etc. 3. Breast Cancer- A self breast exam every month is important for early detection of breast cancer.  For more information and instruction on self breast exams, ask your healthcare provider or [email or web address].[email or web address]. 4. Eat a healthy diet- Eat a variety of healthy foods such as fruits, vegetables, whole grains, low fat milk, low fat cheeses, yogurt, lean meats, poultry and fish, beans, nuts, tofu, etc.  For more information go to www. Thenutritionsource.org 5. Drink alcohol in moderation- Limit alcohol intake to one drink or less per day. Never drink and drive. 6. Depression- Your emotional health is as important as your physical health.  If you're feeling down or losing interest in things you normally enjoy please talk to your healthcare provider about being screened for depression. 7. Dental visit- Brush and floss your teeth twice daily; visit your dentist twice a year. 8. Eye doctor- Get an eye exam at least every 2 years. 9. Helmet use- Always wear a helmet when riding a bicycle, motorcycle, rollerblading or skateboarding. 10. Safe sex- If you may be exposed to sexually transmitted infections, use a condom. 11. Seat belts- Seat belts can save your live; always wear one. 12. Smoke/Carbon Monoxide detectors- These detectors need to be installed on the appropriate level of your home. Replace batteries at least once a year. 13. Skin cancer- When out in the sun please cover up and use sunscreen 15 SPF or higher. 14. Violence-  If anyone is threatening or hurting you, please tell your healthcare provider.     Human Papillomavirus (HPV) Gardasil Vaccine What You Need to Know WHAT IS HPV?  Genital human papillomavirus (HPV) is the most common sexually transmitted virus in the [place]. More than half of sexually active men and women are infected with HPV at some time in  their lives.  About 20 million Americans are currently infected, and about 6 million more get infected each year. HPV is usually spread through sexual contact.  Most HPV infections do not cause any symptoms and go away on their own. But HPV can cause cervical cancer in women. Cervical cancer is the 2nd leading cause of cancer deaths among women around the world. In the [place], about 12,000 women get cervical cancer every year and about 4,000 are expected to die from it.  HPV is also associated with several less common cancers, such as vaginal and vulvar cancers in women, and anal and oropharyngeal (back of the throat, including base of tongue and tonsils) cancers in both men and women. HPV can also cause genital warts and warts in the throat.  There is no cure for HPV infection, but some of the problems it causes can be treated. HPV VACCINE: WHY GET VACCINATED?  The HPV vaccine you are getting is 1 of 2 vaccines that can be given to prevent HPV. It may be given to both males and females.  This vaccine can prevent most cases of cervical cancer in females, if it is given before exposure to the virus. In addition, it can prevent vaginal and vulvar cancer in females, and genital warts and anal cancer in both males and females.  Protection from HPV vaccine is expected to be long-lasting. But vaccination is not a substitute for cervical cancer screening. Women should still get regular Pap tests. WHO SHOULD GET THIS HPV VACCINE AND WHEN? HPV vaccine is given as a 3-dose series.  1st Dose: Now.  2nd Dose: 1 to 2 months after Dose 1.  3rd Dose: 6 months after Dose 1. Additional (booster) doses are not recommended. Routine Vaccination This HPV vaccine is recommended for girls and boys 34 or 24 years of age. It may be given starting at age 4. Why is HPV vaccine recommended at 58 or 24 years of age?  HPV infection is easily acquired, even with only one sex partner. That is why it is important  to get HPV vaccine before any sexual contact takes place. Also, response to the vaccine is better at this age than at older ages. Catch-Up Vaccination This vaccine is recommended for the following people who have not completed the 3-dose series:   Females 13 through 24 years of age.  Males 13 through 24 years of age. This vaccine may be given to men 22 through 24 years of age who have not completed the 3-dose series. It is recommended for men through age 40 who have sex with men or whose immune system is weakened because of HIV infection, other illness, or medications.  HPV vaccine may be given at the same time as other vaccines. SOME PEOPLE SHOULD NOT GET HPV VACCINE OR SHOULD WAIT  Anyone who has ever had a life-threatening allergic reaction to any other component of HPV vaccine, or to a previous dose of HPV vaccine, should not get the vaccine. Tell your doctor if the person getting vaccinated has any severe allergies, including an allergy to yeast.  HPV vaccine is not recommended for  pregnant women. However, receiving HPV vaccine when pregnant is not a reason to consider terminating the pregnancy. Women who are breastfeeding may get the vaccine.  People who are mildly ill when a dose of HPV is planned can still be vaccinated. People with a moderate or severe illness should wait until they are better. WHAT ARE THE RISKS FROM THIS VACCINE?  This HPV vaccine has been used in the [place] and around the world for about 6 years and has been very safe.  However, any medicine could possibly cause a serious problem, such as a severe allergic reaction. The risk of any vaccine causing a serious injury, or death, is extremely small.  Life-threatening allergic reactions from vaccines are very rare. If they do occur, it would be within a few minutes to a few hours after the vaccination. Several mild to moderate problems are known to occur with HPV vaccine. These do not last long and go away on their  own.  Reactions in the arm where the shot was given:  Pain (about 8 people in 10).  Redness or swelling (about 1 person in 4).  Fever:  Mild (100 F or 37.8 C) (about 1 person in 10).  Moderate (102 F or 38.9 C) (about 1 person in 62).  Other problems:  Headache (about 1 person in 3).  Fainting: Brief fainting spells and related symptoms (such as jerking movements) can happen after any medical procedure, including vaccination. Sitting or lying down for about 15 minutes after a vaccination can help prevent fainting and injuries caused by falls. Tell your doctor if the patient feels dizzy or lightheaded, or has vision changes or ringing in the ears.  Like all vaccines, HPV vaccines will continue to be monitored for unusual or severe problems. WHAT IF THERE IS A SERIOUS REACTION? What should I look for?  Any unusual condition, such as a high fever or unusual behavior. Signs of a serious allergic reaction can include difficulty breathing, hoarseness or wheezing, hives, paleness, weakness, a fast heartbeat, or dizziness. What should I do?  Call a doctor, or get the person to a doctor right away.  Tell your doctor what happened, the date and time it happened, and when the vaccination was given.  Ask your doctor, nurse, or health department to report the reaction by filing a Vaccine Adverse Event Reporting System (VAERS) form. Or, you can file this report through the VAERS website at www.vaers.LAgents.no or by calling 1-205-466-8187. VAERS does not provide medical advice. THE NATIONAL VACCINE INJURY COMPENSATION PROGRAM  The National Vaccine Injury Compensation Program (VICP) is a federal program that was created to compensate people who may have been injured by certain vaccines.  Persons who believe they may have been injured by a vaccine can learn about the program and about filing a claim by calling 1-769 049 5361 or visiting the VICP website at SpiritualWord.at HOW  CAN I LEARN MORE?  Ask your doctor.  Call your local or state health department.  Contact the Centers for Disease Control and Prevention (CDC):  Call 986-336-4527 (1-800-CDC-INFO)  or  Visit CDC's website at PicCapture.uy CDC Human Papillomavirus (HPV) Gardasil (Interim) 03/10/12 Document Released: 08/08/2006 Document Revised: 08/01/2013 Document Reviewed: 01/30/2013 [facility] Patient Information 2014 [place], [place].    Bacterial Vaginosis Bacterial vaginosis is an infection of the vagina. It happens when too many of certain germs (bacteria) grow in the vagina. HOME CARE  Take your medicine as told by your doctor.  Finish your medicine even if you start to feel  better.  Do not have sex until you finish your medicine and are better.  Tell your sex partner that you have an infection. They should see their doctor for treatment.  Practice safe sex. Use condoms. Have only one sex partner. GET HELP IF:  You are not getting better after 3 days of treatment.  You have more grey fluid (discharge) coming from your vagina than before.  You have more pain than before.  You have a fever. MAKE SURE YOU:   Understand these instructions.  Will watch your condition.  Will get help right away if you are not doing well or get worse. Document Released: 07/20/2008 Document Revised: 08/01/2013 Document Reviewed: 05/23/2013 [facility] Patient Information 2014 [place], [place].

## 2013-12-05 LAB — PAP IG, CT-NG, RFX HPV ASCU
CHLAMYDIA PROBE AMP: NEGATIVE
GC PROBE AMP: NEGATIVE

## 2013-12-05 LAB — HIV ANTIBODY (ROUTINE TESTING W REFLEX): HIV: NONREACTIVE

## 2013-12-07 NOTE — Progress Notes (Signed)
Quick Note:  Please advise pt regarding following labs...   Pap is negative /normal and all labs are normal. HIV test is non-reactive/negative.  Copy to pt. ______

## 2014-03-02 ENCOUNTER — Emergency Department: Payer: Self-pay | Admitting: Emergency Medicine

## 2014-03-02 LAB — CBC WITH DIFFERENTIAL/PLATELET
BASOS ABS: 0 10*3/uL (ref 0.0–0.1)
Basophil %: 0.2 %
Eosinophil #: 0 10*3/uL (ref 0.0–0.7)
Eosinophil %: 0.1 %
HCT: 41.6 % (ref 35.0–47.0)
HGB: 13.9 g/dL (ref 12.0–16.0)
Lymphocyte #: 0.8 10*3/uL — ABNORMAL LOW (ref 1.0–3.6)
Lymphocyte %: 8.5 %
MCH: 30 pg (ref 26.0–34.0)
MCHC: 33.4 g/dL (ref 32.0–36.0)
MCV: 90 fL (ref 80–100)
MONO ABS: 1.6 x10 3/mm — AB (ref 0.2–0.9)
MONOS PCT: 16.3 %
NEUTROS ABS: 7.3 10*3/uL — AB (ref 1.4–6.5)
Neutrophil %: 74.9 %
Platelet: 149 10*3/uL — ABNORMAL LOW (ref 150–440)
RBC: 4.64 10*6/uL (ref 3.80–5.20)
RDW: 13.2 % (ref 11.5–14.5)
WBC: 9.8 10*3/uL (ref 3.6–11.0)

## 2014-03-02 LAB — URINALYSIS, COMPLETE
BILIRUBIN, UR: NEGATIVE
Glucose,UR: NEGATIVE mg/dL (ref 0–75)
Nitrite: POSITIVE
Ph: 5 (ref 4.5–8.0)
Protein: 30
Specific Gravity: 1.017 (ref 1.003–1.030)
Squamous Epithelial: 13
WBC UR: 24 /HPF (ref 0–5)

## 2014-03-02 LAB — COMPREHENSIVE METABOLIC PANEL
ALK PHOS: 67 U/L
ALT: 20 U/L (ref 12–78)
Albumin: 3.7 g/dL (ref 3.4–5.0)
Anion Gap: 9 (ref 7–16)
BILIRUBIN TOTAL: 0.5 mg/dL (ref 0.2–1.0)
BUN: 8 mg/dL (ref 7–18)
CALCIUM: 9.3 mg/dL (ref 8.5–10.1)
CHLORIDE: 99 mmol/L (ref 98–107)
CO2: 26 mmol/L (ref 21–32)
Creatinine: 0.74 mg/dL (ref 0.60–1.30)
GLUCOSE: 84 mg/dL (ref 65–99)
OSMOLALITY: 266 (ref 275–301)
POTASSIUM: 3.2 mmol/L — AB (ref 3.5–5.1)
SGOT(AST): 12 U/L — ABNORMAL LOW (ref 15–37)
Sodium: 134 mmol/L — ABNORMAL LOW (ref 136–145)
TOTAL PROTEIN: 8.4 g/dL — AB (ref 6.4–8.2)

## 2014-03-02 LAB — TSH: THYROID STIMULATING HORM: 1.18 u[IU]/mL

## 2014-08-26 ENCOUNTER — Encounter: Payer: Self-pay | Admitting: Family Medicine

## 2016-04-29 ENCOUNTER — Encounter: Payer: Self-pay | Admitting: Obstetrics & Gynecology

## 2016-04-29 ENCOUNTER — Ambulatory Visit (INDEPENDENT_AMBULATORY_CARE_PROVIDER_SITE_OTHER): Payer: Self-pay | Admitting: Obstetrics & Gynecology

## 2016-04-29 VITALS — BP 118/78 | HR 80 | Wt 107.0 lb

## 2016-04-29 DIAGNOSIS — Z3481 Encounter for supervision of other normal pregnancy, first trimester: Secondary | ICD-10-CM

## 2016-04-29 DIAGNOSIS — Z36 Encounter for antenatal screening of mother: Secondary | ICD-10-CM

## 2016-04-29 DIAGNOSIS — Z3491 Encounter for supervision of normal pregnancy, unspecified, first trimester: Secondary | ICD-10-CM

## 2016-04-29 NOTE — Progress Notes (Signed)
Pt here for initial prenatal appt. She has pending insurance. Initial physical exam, labs and Pap at next visit.  Bedside US shows single IUP measuring 3453w5d CRL and FHR 175

## 2016-04-29 NOTE — Progress Notes (Addendum)
Subjective:  Brandi Montgomery is a 25 y.o. G2P1001 at 4046w1d. Pt here for initial prenatal appt. She has pending insurance. Initial physical exam, labs and Pap at next visit.  Bedside US shows single IUP measuring 7426w5d CRL and FHR 175 Patient reports nausea.  Contractions: Not present.  Vag. Bleeding: None. Movement: Absent. Denies leaking of fluid.   The following portions of the patient's history were reviewed and updated as appropriate: allergies, current medications, past family history, past medical history, past social history, past surgical history and problem list.   Objective:   Filed Vitals:   04/29/16 1025  BP: 118/78  Pulse: 80  Weight: 107 lb (48.535 kg)    Fetal Status: Fetal Heart Rate (bpm): 175   Movement: Absent      Assessment and Plan:  Pregnancy: G2P1001 at 7346w1d  1. Supervision of normal pregnancy, first trimester Labor symptoms and general obstetric precautions including but not limited to vaginal bleeding, contractions, leaking of fluid and fetal movement were reviewed in detail with the patient. Please refer to After Visit Summary for other counseling recommendations.  Return in about 2 weeks (around 05/13/2016) for New OB.   Arne ClevelandMandy J Hutchinson, CMA

## 2016-05-13 ENCOUNTER — Encounter: Payer: Self-pay | Admitting: Obstetrics & Gynecology

## 2016-05-27 ENCOUNTER — Encounter: Payer: Self-pay | Admitting: Obstetrics & Gynecology

## 2016-07-01 ENCOUNTER — Ambulatory Visit (INDEPENDENT_AMBULATORY_CARE_PROVIDER_SITE_OTHER): Payer: Self-pay | Admitting: Family Medicine

## 2016-07-01 ENCOUNTER — Encounter: Payer: Self-pay | Admitting: Family Medicine

## 2016-07-01 DIAGNOSIS — Z3492 Encounter for supervision of normal pregnancy, unspecified, second trimester: Secondary | ICD-10-CM

## 2016-07-01 DIAGNOSIS — Z3490 Encounter for supervision of normal pregnancy, unspecified, unspecified trimester: Secondary | ICD-10-CM | POA: Insufficient documentation

## 2016-07-01 NOTE — Patient Instructions (Signed)
Second Trimester of Pregnancy The second trimester is from week 13 through week 28, months 4 through 6. The second trimester is often a time when you feel your best. Your body has also adjusted to being pregnant, and you begin to feel better physically. Usually, morning sickness has lessened or quit completely, you may have more energy, and you may have an increase in appetite. The second trimester is also a time when the fetus is growing rapidly. At the end of the sixth month, the fetus is about 9 inches long and weighs about 1 pounds. You will likely begin to feel the baby move (quickening) between 18 and 20 weeks of the pregnancy. BODY CHANGES Your body goes through many changes during pregnancy. The changes vary from woman to woman.   Your weight will continue to increase. You will notice your lower abdomen bulging out.  You may begin to get stretch marks on your hips, abdomen, and breasts.  You may develop headaches that can be relieved by medicines approved by your health care provider.  You may urinate more often because the fetus is pressing on your bladder.  You may develop or continue to have heartburn as a result of your pregnancy.  You may develop constipation because certain hormones are causing the muscles that push waste through your intestines to slow down.  You may develop hemorrhoids or swollen, bulging veins (varicose veins).  You may have back pain because of the weight gain and pregnancy hormones relaxing your joints between the bones in your pelvis and as a result of a shift in weight and the muscles that support your balance.  Your breasts will continue to grow and be tender.  Your gums may bleed and may be sensitive to brushing and flossing.  Dark spots or blotches (chloasma, mask of pregnancy) may develop on your face. This will likely fade after the baby is born.  A dark line from your belly button to the pubic area (linea nigra) may appear. This will likely  fade after the baby is born.  You may have changes in your hair. These can include thickening of your hair, rapid growth, and changes in texture. Some women also have hair loss during or after pregnancy, or hair that feels dry or thin. Your hair will most likely return to normal after your baby is born. WHAT TO EXPECT AT YOUR PRENATAL VISITS During a routine prenatal visit:  You will be weighed to make sure you and the fetus are growing normally.  Your blood pressure will be taken.  Your abdomen will be measured to track your baby's growth.  The fetal heartbeat will be listened to.  Any test results from the previous visit will be discussed. Your health care provider may ask you:  How you are feeling.  If you are feeling the baby move.  If you have had any abnormal symptoms, such as leaking fluid, bleeding, severe headaches, or abdominal cramping.  If you are using any tobacco products, including cigarettes, chewing tobacco, and electronic cigarettes.  If you have any questions. Other tests that may be performed during your second trimester include:  Blood tests that check for:  Low iron levels (anemia).  Gestational diabetes (between 24 and 28 weeks).  Rh antibodies.  Urine tests to check for infections, diabetes, or protein in the urine.  An ultrasound to confirm the proper growth and development of the baby.  An amniocentesis to check for possible genetic problems.  Fetal screens for spina bifida   and Down syndrome.  HIV (human immunodeficiency virus) testing. Routine prenatal testing includes screening for HIV, unless you choose not to have this test. HOME CARE INSTRUCTIONS   Avoid all smoking, herbs, alcohol, and unprescribed drugs. These chemicals affect the formation and growth of the baby.  Do not use any tobacco products, including cigarettes, chewing tobacco, and electronic cigarettes. If you need help quitting, ask your health care provider. You may receive  counseling support and other resources to help you quit.  Follow your health care provider's instructions regarding medicine use. There are medicines that are either safe or unsafe to take during pregnancy.  Exercise only as directed by your health care provider. Experiencing uterine cramps is a good sign to stop exercising.  Continue to eat regular, healthy meals.  Wear a good support bra for breast tenderness.  Do not use hot tubs, steam rooms, or saunas.  Wear your seat belt at all times when driving.  Avoid raw meat, uncooked cheese, cat litter boxes, and soil used by cats. These carry germs that can cause birth defects in the baby.  Take your prenatal vitamins.  Take 1500-2000 mg of calcium daily starting at the 20th week of pregnancy until you deliver your baby.  Try taking a stool softener (if your health care provider approves) if you develop constipation. Eat more high-fiber foods, such as fresh vegetables or fruit and whole grains. Drink plenty of fluids to keep your urine clear or pale yellow.  Take warm sitz baths to soothe any pain or discomfort caused by hemorrhoids. Use hemorrhoid cream if your health care provider approves.  If you develop varicose veins, wear support hose. Elevate your feet for 15 minutes, 3-4 times a day. Limit salt in your diet.  Avoid heavy lifting, wear low heel shoes, and practice good posture.  Rest with your legs elevated if you have leg cramps or low back pain.  Visit your dentist if you have not gone yet during your pregnancy. Use a soft toothbrush to brush your teeth and be gentle when you floss.  A sexual relationship may be continued unless your health care provider directs you otherwise.  Continue to go to all your prenatal visits as directed by your health care provider. SEEK MEDICAL CARE IF:   You have dizziness.  You have mild pelvic cramps, pelvic pressure, or nagging pain in the abdominal area.  You have persistent nausea,  vomiting, or diarrhea.  You have a bad smelling vaginal discharge.  You have pain with urination. SEEK IMMEDIATE MEDICAL CARE IF:   You have a fever.  You are leaking fluid from your vagina.  You have spotting or bleeding from your vagina.  You have severe abdominal cramping or pain.  You have rapid weight gain or loss.  You have shortness of breath with chest pain.  You notice sudden or extreme swelling of your face, hands, ankles, feet, or legs.  You have not felt your baby move in over an hour.  You have severe headaches that do not go away with medicine.  You have vision changes.   This information is not intended to replace advice given to you by your health care provider. Make sure you discuss any questions you have with your health care provider.   Document Released: 10/05/2001 Document Revised: 11/01/2014 Document Reviewed: 12/12/2012 Elsevier Interactive Patient Education 2016 [employer]   Breastfeeding Deciding to breastfeed is one of the best choices you can make for you and your baby. A change   in hormones during pregnancy causes your breast tissue to grow and increases the number and size of your milk ducts. These hormones also allow proteins, sugars, and fats from your blood supply to make breast milk in your milk-producing glands. Hormones prevent breast milk from being released before your baby is born as well as prompt milk flow after birth. Once breastfeeding has begun, thoughts of your baby, as well as his or her sucking or crying, can stimulate the release of milk from your milk-producing glands.  BENEFITS OF BREASTFEEDING For Your Baby  Your first milk (colostrum) helps your baby's digestive system function better.  There are antibodies in your milk that help your baby fight off infections.  Your baby has a lower incidence of asthma, allergies, and sudden infant death syndrome.  The nutrients in breast milk are better for your baby than infant  formulas and are designed uniquely for your baby's needs.  Breast milk improves your baby's brain development.  Your baby is less likely to develop other conditions, such as childhood obesity, asthma, or type 2 diabetes mellitus. For You  Breastfeeding helps to create a very special bond between you and your baby.  Breastfeeding is convenient. Breast milk is always available at the correct temperature and costs nothing.  Breastfeeding helps to burn calories and helps you lose the weight gained during pregnancy.  Breastfeeding makes your uterus contract to its prepregnancy size faster and slows bleeding (lochia) after you give birth.   Breastfeeding helps to lower your risk of developing type 2 diabetes mellitus, osteoporosis, and breast or ovarian cancer later in life. SIGNS THAT YOUR BABY IS HUNGRY Early Signs of Hunger  Increased alertness or activity.  Stretching.  Movement of the head from side to side.  Movement of the head and opening of the mouth when the corner of the mouth or cheek is stroked (rooting).  Increased sucking sounds, smacking lips, cooing, sighing, or squeaking.  Hand-to-mouth movements.  Increased sucking of fingers or hands. Late Signs of Hunger  Fussing.  Intermittent crying. Extreme Signs of Hunger Signs of extreme hunger will require calming and consoling before your baby will be able to breastfeed successfully. Do not wait for the following signs of extreme hunger to occur before you initiate breastfeeding:  Restlessness.  A loud, strong cry.  Screaming. BREASTFEEDING BASICS Breastfeeding Initiation  Find a comfortable place to sit or lie down, with your neck and back well supported.  Place a pillow or rolled up blanket under your baby to bring him or her to the level of your breast (if you are seated). Nursing pillows are specially designed to help support your arms and your baby while you breastfeed.  Make sure that your baby's  abdomen is facing your abdomen.  Gently massage your breast. With your fingertips, massage from your chest wall toward your nipple in a circular motion. This encourages milk flow. You may need to continue this action during the feeding if your milk flows slowly.  Support your breast with 4 fingers underneath and your thumb above your nipple. Make sure your fingers are well away from your nipple and your baby's mouth.  Stroke your baby's lips gently with your finger or nipple.  When your baby's mouth is open wide enough, quickly bring your baby to your breast, placing your entire nipple and as much of the colored area around your nipple (areola) as possible into your baby's mouth.  More areola should be visible above your baby's upper lip than   below the lower lip.  Your baby's tongue should be between his or her lower gum and your breast.  Ensure that your baby's mouth is correctly positioned around your nipple (latched). Your baby's lips should create a seal on your breast and be turned out (everted).  It is common for your baby to suck about 2-3 minutes in order to start the flow of breast milk. Latching Teaching your baby how to latch on to your breast properly is very important. An improper latch can cause nipple pain and decreased milk supply for you and poor weight gain in your baby. Also, if your baby is not latched onto your nipple properly, he or she may swallow some air during feeding. This can make your baby fussy. Burping your baby when you switch breasts during the feeding can help to get rid of the air. However, teaching your baby to latch on properly is still the best way to prevent fussiness from swallowing air while breastfeeding. Signs that your baby has successfully latched on to your nipple:  Silent tugging or silent sucking, without causing you pain.  Swallowing heard between every 3-4 sucks.  Muscle movement above and in front of his or her ears while sucking. Signs  that your baby has not successfully latched on to nipple:  Sucking sounds or smacking sounds from your baby while breastfeeding.  Nipple pain. If you think your baby has not latched on correctly, slip your finger into the corner of your baby's mouth to break the suction and place it between your baby's gums. Attempt breastfeeding initiation again. Signs of Successful Breastfeeding Signs from your baby:  A gradual decrease in the number of sucks or complete cessation of sucking.  Falling asleep.  Relaxation of his or her body.  Retention of a small amount of milk in his or her mouth.  Letting go of your breast by himself or herself. Signs from you:  Breasts that have increased in firmness, weight, and size 1-3 hours after feeding.  Breasts that are softer immediately after breastfeeding.  Increased milk volume, as well as a change in milk consistency and color by the fifth day of breastfeeding.  Nipples that are not sore, cracked, or bleeding. Signs That Your Baby is Getting Enough Milk  Wetting at least 3 diapers in a 24-hour period. The urine should be clear and pale yellow by age 5 days.  At least 3 stools in a 24-hour period by age 5 days. The stool should be soft and yellow.  At least 3 stools in a 24-hour period by age 7 days. The stool should be seedy and yellow.  No loss of weight greater than 10% of birth weight during the first 3 days of age.  Average weight gain of 4-7 ounces (113-198 g) per week after age 4 days.  Consistent daily weight gain by age 5 days, without weight loss after the age of 2 weeks. After a feeding, your baby may spit up a small amount. This is common. BREASTFEEDING FREQUENCY AND DURATION Frequent feeding will help you make more milk and can prevent sore nipples and breast engorgement. Breastfeed when you feel the need to reduce the fullness of your breasts or when your baby shows signs of hunger. This is called "breastfeeding on demand." Avoid  introducing a pacifier to your baby while you are working to establish breastfeeding (the first 4-6 weeks after your baby is born). After this time you may choose to use a pacifier. Research has shown that   pacifier use during the first year of a baby's life decreases the risk of sudden infant death syndrome (SIDS). Allow your baby to feed on each breast as long as he or she wants. Breastfeed until your baby is finished feeding. When your baby unlatches or falls asleep while feeding from the first breast, offer the second breast. Because newborns are often sleepy in the first few weeks of life, you may need to awaken your baby to get him or her to feed. Breastfeeding times will vary from baby to baby. However, the following rules can serve as a guide to help you ensure that your baby is properly fed:  Newborns (babies 4 weeks of age or younger) may breastfeed every 1-3 hours.  Newborns should not go longer than 3 hours during the day or 5 hours during the night without breastfeeding.  You should breastfeed your baby a minimum of 8 times in a 24-hour period until you begin to introduce solid foods to your baby at around 6 months of age. BREAST MILK PUMPING Pumping and storing breast milk allows you to ensure that your baby is exclusively fed your breast milk, even at times when you are unable to breastfeed. This is especially important if you are going back to work while you are still breastfeeding or when you are not able to be present during feedings. Your lactation consultant can give you guidelines on how long it is safe to store breast milk. A breast pump is a machine that allows you to pump milk from your breast into a sterile bottle. The pumped breast milk can then be stored in a refrigerator or freezer. Some breast pumps are operated by hand, while others use electricity. Ask your lactation consultant which type will work best for you. Breast pumps can be purchased, but some hospitals and  breastfeeding support groups lease breast pumps on a monthly basis. A lactation consultant can teach you how to hand express breast milk, if you prefer not to use a pump. CARING FOR YOUR BREASTS WHILE YOU BREASTFEED Nipples can become dry, cracked, and sore while breastfeeding. The following recommendations can help keep your breasts moisturized and healthy:  Avoid using soap on your nipples.  Wear a supportive bra. Although not required, special nursing bras and tank tops are designed to allow access to your breasts for breastfeeding without taking off your entire bra or top. Avoid wearing underwire-style bras or extremely tight bras.  Air dry your nipples for 3-4minutes after each feeding.  Use only cotton bra pads to absorb leaked breast milk. Leaking of breast milk between feedings is normal.  Use lanolin on your nipples after breastfeeding. Lanolin helps to maintain your skin's normal moisture barrier. If you use pure lanolin, you do not need to wash it off before feeding your baby again. Pure lanolin is not toxic to your baby. You may also hand express a few drops of breast milk and gently massage that milk into your nipples and allow the milk to air dry. In the first few weeks after giving birth, some women experience extremely full breasts (engorgement). Engorgement can make your breasts feel heavy, warm, and tender to the touch. Engorgement peaks within 3-5 days after you give birth. The following recommendations can help ease engorgement:  Completely empty your breasts while breastfeeding or pumping. You may want to start by applying warm, moist heat (in the shower or with warm water-soaked hand towels) just before feeding or pumping. This increases circulation and helps the milk   flow. If your baby does not completely empty your breasts while breastfeeding, pump any extra milk after he or she is finished.  Wear a snug bra (nursing or regular) or tank top for 1-2 days to signal your body  to slightly decrease milk production.  Apply ice packs to your breasts, unless this is too uncomfortable for you.  Make sure that your baby is latched on and positioned properly while breastfeeding. If engorgement persists after 48 hours of following these recommendations, contact your health care provider or a lactation consultant. OVERALL HEALTH CARE RECOMMENDATIONS WHILE BREASTFEEDING  Eat healthy foods. Alternate between meals and snacks, eating 3 of each per day. Because what you eat affects your breast milk, some of the foods may make your baby more irritable than usual. Avoid eating these foods if you are sure that they are negatively affecting your baby.  Drink milk, fruit juice, and water to satisfy your thirst (about 10 glasses a day).  Rest often, relax, and continue to take your prenatal vitamins to prevent fatigue, stress, and anemia.  Continue breast self-awareness checks.  Avoid chewing and smoking tobacco. Chemicals from cigarettes that pass into breast milk and exposure to secondhand smoke may harm your baby.  Avoid alcohol and drug use, including marijuana. Some medicines that may be harmful to your baby can pass through breast milk. It is important to ask your health care provider before taking any medicine, including all over-the-counter and prescription medicine as well as vitamin and herbal supplements. It is possible to become pregnant while breastfeeding. If birth control is desired, ask your health care provider about options that will be safe for your baby. SEEK MEDICAL CARE IF:  You feel like you want to stop breastfeeding or have become frustrated with breastfeeding.  You have painful breasts or nipples.  Your nipples are cracked or bleeding.  Your breasts are red, tender, or warm.  You have a swollen area on either breast.  You have a fever or chills.  You have nausea or vomiting.  You have drainage other than breast milk from your nipples.  Your  breasts do not become full before feedings by the fifth day after you give birth.  You feel sad and depressed.  Your baby is too sleepy to eat well.  Your baby is having trouble sleeping.   Your baby is wetting less than 3 diapers in a 24-hour period.  Your baby has less than 3 stools in a 24-hour period.  Your baby's skin or the white part of his or her eyes becomes yellow.   Your baby is not gaining weight by 5 days of age. SEEK IMMEDIATE MEDICAL CARE IF:  Your baby is overly tired (lethargic) and does not want to wake up and feed.  Your baby develops an unexplained fever.   This information is not intended to replace advice given to you by your health care provider. Make sure you discuss any questions you have with your health care provider.   Document Released: 10/11/2005 Document Revised: 07/02/2015 Document Reviewed: 04/04/2013 Elsevier Interactive Patient Education 2016 [employer]  

## 2016-07-02 NOTE — Progress Notes (Signed)
   PRENATAL VISIT NOTE  Subjective:  Brandi Montgomery is a 25 y.o. G2P1001 at 2775w2d being seen today for ongoing prenatal care.  She is currently monitored for the following issues for this low-risk pregnancy and has Supervision of normal pregnancy on her problem list.  Patient reports no complaints.  Contractions: Not present. Vag. Bleeding: None.  Movement: Present. Denies leaking of fluid.   The following portions of the patient's history were reviewed and updated as appropriate: allergies, current medications, past family history, past medical history, past social history, past surgical history and problem list. Problem list updated.  Objective:   Vitals:   07/01/16 1000  BP: 113/73  Pulse: 89  Weight: 123 lb (55.8 kg)    Fetal Status: Fetal Heart Rate (bpm): 142   Movement: Present     General:  Alert, oriented and cooperative. Patient is in no acute distress.  Skin: Skin is warm and dry. No rash noted.   Cardiovascular: Normal heart rate noted  Respiratory: Normal respiratory effort, no problems with respiration noted  Abdomen: Soft, gravid, appropriate for gestational age. Pain/Pressure: Absent     Pelvic:  Cervical exam deferred        Extremities: Normal range of motion.  Edema: None  Mental Status: Normal mood and affect. Normal behavior. Normal judgment and thought content.   Urinalysis:      Assessment and Plan:  Pregnancy: G2P1001 at 5275w2d  1. Supervision of normal pregnancy, second trimester Still without insurance--u/s at the [facility] once she has insurance. - US OB Comp + 14 Wk; Future  Preterm labor symptoms and general obstetric precautions including but not limited to vaginal bleeding, contractions, leaking of fluid and fetal movement were reviewed in detail with the patient. Please refer to After Visit Summary for other counseling recommendations.  Return in 4 weeks (on 07/29/2016).  Reva Boresanya S Margean Korell, MD

## 2016-07-30 ENCOUNTER — Encounter: Payer: Self-pay | Admitting: Obstetrics & Gynecology

## 2016-09-01 ENCOUNTER — Other Ambulatory Visit: Payer: Self-pay | Admitting: *Deleted

## 2016-09-01 ENCOUNTER — Encounter: Payer: Self-pay | Admitting: *Deleted

## 2016-09-01 ENCOUNTER — Ambulatory Visit (INDEPENDENT_AMBULATORY_CARE_PROVIDER_SITE_OTHER): Payer: Self-pay | Admitting: Obstetrics and Gynecology

## 2016-09-01 VITALS — BP 112/74 | HR 86 | Wt 140.0 lb

## 2016-09-01 DIAGNOSIS — Z3490 Encounter for supervision of normal pregnancy, unspecified, unspecified trimester: Secondary | ICD-10-CM

## 2016-09-01 DIAGNOSIS — Z23 Encounter for immunization: Secondary | ICD-10-CM

## 2016-09-01 DIAGNOSIS — Z3492 Encounter for supervision of normal pregnancy, unspecified, second trimester: Secondary | ICD-10-CM

## 2016-09-01 DIAGNOSIS — Z3493 Encounter for supervision of normal pregnancy, unspecified, third trimester: Secondary | ICD-10-CM

## 2016-09-01 NOTE — Progress Notes (Signed)
Rou

## 2016-09-01 NOTE — Progress Notes (Signed)
   PRENATAL VISIT NOTE  Subjective:  Brandi Montgomery is a 25 y.o. G2P1001 at 6562w0d being seen today for ongoing prenatal care.  She is currently monitored for the following issues for this low-risk pregnancy and has Supervision of normal pregnancy on her problem list.  Patient reports no complaints.  Contractions: Not present. Vag. Bleeding: None.  Movement: Present. Denies leaking of fluid.   The following portions of the patient's history were reviewed and updated as appropriate: allergies, current medications, past family history, past medical history, past social history, past surgical history and problem list. Problem list updated.  Objective:   Vitals:   09/01/16 0900  BP: 112/74  Pulse: 86  Weight: 140 lb (63.5 kg)    Fetal Status: Fetal Heart Rate (bpm): 146 Fundal Height: 28 cm Movement: Present     General:  Alert, oriented and cooperative. Patient is in no acute distress.  Skin: Skin is warm and dry. No rash noted.   Cardiovascular: Normal heart rate noted  Respiratory: Normal respiratory effort, no problems with respiration noted  Abdomen: Soft, gravid, appropriate for gestational age. Pain/Pressure: Absent     Pelvic:  Cervical exam deferred        Extremities: Normal range of motion.  Edema: Trace  Mental Status: Normal mood and affect. Normal behavior. Normal judgment and thought content.   Assessment and Plan:  Pregnancy: G2P1001 at 2062w0d  1. Encounter for supervision of normal pregnancy, antepartum, unspecified gravidity Patient is doing well without complaints [facility]- records waiting to be faxed over 3rd trimester labs today  Patient agrees to Tdap and Flu vaccine - Glucose Tolerance, 2 Hours w/1 Hour - CBC - HIV antibody - RPR - Tdap vaccine greater than or equal to 7yo IM - Flu Vaccine QUAD 36+ mos IM (Fluarix, Quad PF)  Preterm labor symptoms and general obstetric precautions  including but not limited to vaginal bleeding, contractions, leaking of fluid and fetal movement were reviewed in detail with the patient. Please refer to After Visit Summary for other counseling recommendations.  No Follow-up on file.   Catalina AntiguaPeggy Dontea Corlew, MD

## 2016-09-02 LAB — CBC

## 2016-09-02 LAB — GLUCOSE TOLERANCE, 2 HOURS W/ 1HR
GLUCOSE, 2 HOUR: 102 mg/dL (ref <140)
Glucose, 1 hour: 113 mg/dL
Glucose, Fasting: 74 mg/dL (ref 65–99)

## 2016-09-02 LAB — HIV ANTIBODY (ROUTINE TESTING W REFLEX): HIV: NONREACTIVE

## 2016-09-02 LAB — RPR

## 2016-09-22 ENCOUNTER — Encounter: Payer: Self-pay | Admitting: Obstetrics and Gynecology

## 2016-09-27 ENCOUNTER — Ambulatory Visit (INDEPENDENT_AMBULATORY_CARE_PROVIDER_SITE_OTHER): Payer: BLUE CROSS/BLUE SHIELD | Admitting: Obstetrics & Gynecology

## 2016-09-27 VITALS — BP 114/76 | HR 108 | Wt 148.0 lb

## 2016-09-27 DIAGNOSIS — Z3483 Encounter for supervision of other normal pregnancy, third trimester: Secondary | ICD-10-CM

## 2016-09-27 NOTE — Patient Instructions (Signed)
Return to clinic for any scheduled appointments or obstetric concerns, or go to [facility]U for evaluation  

## 2016-09-27 NOTE — Progress Notes (Signed)
   PRENATAL VISIT NOTE  Subjective:  [place] G Pinnix is a 25 y.o. G2P1001 at 5359w5d being seen today for ongoing prenatal care.  She is currently monitored for the following issues for this low-risk pregnancy and has Supervision of normal pregnancy on her problem list.  Patient reports no complaints.  Contractions: Not present. Vag. Bleeding: None.  Movement: Present. Denies leaking of fluid.   The following portions of the patient's history were reviewed and updated as appropriate: allergies, current medications, past family history, past medical history, past social history, past surgical history and problem list. Problem list updated.  Objective:   Vitals:   09/27/16 1433  BP: 114/76  Pulse: (!) 108  Weight: 148 lb (67.1 kg)    Fetal Status: Fetal Heart Rate (bpm): 146   Movement: Present     General:  Alert, oriented and cooperative. Patient is in no acute distress.  Skin: Skin is warm and dry. No rash noted.   Cardiovascular: Normal heart rate noted  Respiratory: Normal respiratory effort, no problems with respiration noted  Abdomen: Soft, gravid, appropriate for gestational age. Pain/Pressure: Absent     Pelvic:  Cervical exam deferred        Extremities: Normal range of motion.  Edema: None  Mental Status: Normal mood and affect. Normal behavior. Normal judgment and thought content.   Assessment and Plan:  Pregnancy: G2P1001 at 4459w5d  1. Encounter for supervision of other normal pregnancy in third trimester Missing prenatal screen ordered - CBC - Rubella screen - Hepatitis B surface antigen - Cystic fibrosis diagnostic study - Hemoglobinopathy Evaluation  Preterm labor symptoms and general obstetric precautions including but not limited to vaginal bleeding, contractions, leaking of fluid and fetal movement were reviewed in detail with the patient. Please refer to After Visit Summary for other counseling recommendations.  Return in about 2 weeks (around 10/11/2016)  for OB Visit.   Tereso NewcomerUgonna A Creedence Kunesh, MD

## 2016-09-28 LAB — CBC
HCT: 32.1 % — ABNORMAL LOW (ref 35.0–45.0)
Hemoglobin: 10.5 g/dL — ABNORMAL LOW (ref 11.7–15.5)
MCH: 28 pg (ref 27.0–33.0)
MCHC: 32.7 g/dL (ref 32.0–36.0)
MCV: 85.6 fL (ref 80.0–100.0)
MPV: 11.7 fL (ref 7.5–12.5)
PLATELETS: 255 10*3/uL (ref 140–400)
RBC: 3.75 MIL/uL — ABNORMAL LOW (ref 3.80–5.10)
RDW: 13.1 % (ref 11.0–15.0)
WBC: 8.6 10*3/uL (ref 3.8–10.8)

## 2016-09-28 LAB — HEPATITIS B SURFACE ANTIGEN: HEP B S AG: NEGATIVE

## 2016-09-28 LAB — RUBELLA SCREEN: RUBELLA: 1.45 {index} — AB (ref <0.90)

## 2016-09-29 LAB — HEMOGLOBINOPATHY EVALUATION
HCT: 32.1 % — ABNORMAL LOW (ref 35.0–45.0)
HEMOGLOBIN: 10.5 g/dL — AB (ref 11.7–15.5)
Hgb A2 Quant: 2.3 % (ref 1.8–3.5)
Hgb A: 96.7 % (ref >96.0)
Hgb F Quant: <1 % (ref <2.0)
MCH: 28 pg (ref 27.0–33.0)
MCV: 85.6 fL (ref 80.0–100.0)
RBC: 3.75 MIL/uL — ABNORMAL LOW (ref 3.80–5.10)
RDW: 13.1 % (ref 11.0–15.0)

## 2016-10-01 LAB — CYSTIC FIBROSIS DIAGNOSTIC STUDY

## 2016-10-12 ENCOUNTER — Ambulatory Visit (INDEPENDENT_AMBULATORY_CARE_PROVIDER_SITE_OTHER): Payer: BLUE CROSS/BLUE SHIELD | Admitting: Obstetrics & Gynecology

## 2016-10-12 DIAGNOSIS — Z3483 Encounter for supervision of other normal pregnancy, third trimester: Secondary | ICD-10-CM

## 2016-10-12 NOTE — Patient Instructions (Signed)
Return to clinic for any scheduled appointments or obstetric concerns, or go to [facility]U for evaluation  

## 2016-10-12 NOTE — Progress Notes (Signed)
   PRENATAL VISIT NOTE  Subjective:  [place] G Pinnix is a 25 y.o. G2P1001 at 2585w6d being seen today for ongoing prenatal care.  She is currently monitored for the following issues for this low-risk pregnancy and has Supervision of normal pregnancy on her problem list.  Patient reports no complaints.  Contractions: Not present. Vag. Bleeding: None.  Movement: Present. Denies leaking of fluid.   The following portions of the patient's history were reviewed and updated as appropriate: allergies, current medications, past family history, past medical history, past social history, past surgical history and problem list. Problem list updated.  Objective:   Vitals:   10/12/16 0837  BP: 122/79  Pulse: (!) 118  Weight: 153 lb (69.4 kg)    Fetal Status: Fetal Heart Rate (bpm): 143 Fundal Height: 34 cm Movement: Present     General:  Alert, oriented and cooperative. Patient is in no acute distress.  Skin: Skin is warm and dry. No rash noted.   Cardiovascular: Normal heart rate noted  Respiratory: Normal respiratory effort, no problems with respiration noted  Abdomen: Soft, gravid, appropriate for gestational age. Pain/Pressure: Present     Pelvic:  Cervical exam deferred        Extremities: Normal range of motion.  Edema: None  Mental Status: Normal mood and affect. Normal behavior. Normal judgment and thought content.   Assessment and Plan:  Pregnancy: G2P1001 at 5885w6d  1. Encounter for supervision of other normal pregnancy in third trimester Preterm labor symptoms and general obstetric precautions including but not limited to vaginal bleeding, contractions, leaking of fluid and fetal movement were reviewed in detail with the patient. Will continue to observe tachycardia; no CP/SOB currently. Please refer to After Visit Summary for other counseling recommendations.  Return in about 2 weeks (around 10/26/2016) for OB Visit, Pelvic cultures.   Tereso NewcomerUgonna A Anyanwu, MD

## 2016-10-25 NOTE — L&D Delivery Note (Signed)
Delivery Note Pt became complete at 2145 and pushed well with a few contractions, and at 9:54 PM a viable female was delivered via Vaginal, Spontaneous Delivery (Presentation: ROA ).  APGAR: 8,9 ; weight: pending. Cord clamped and cut by FOB; hospital cord blood sample collected. Placenta status: spont, intact .  Cord: 3 vessel  Anesthesia:  Epidural Episiotomy: None  Lacerations:  None Est. Blood Loss (mL): 200   Mom to postpartum.  Baby to Couplet care / Skin to Skin.  Brandi Montgomery, KIMBERLY CNM 12/01/2016, 10:05 PM

## 2016-10-28 ENCOUNTER — Ambulatory Visit (INDEPENDENT_AMBULATORY_CARE_PROVIDER_SITE_OTHER): Payer: BLUE CROSS/BLUE SHIELD | Admitting: Student

## 2016-10-28 VITALS — BP 117/79 | HR 102 | Wt 157.0 lb

## 2016-10-28 DIAGNOSIS — Z3483 Encounter for supervision of other normal pregnancy, third trimester: Secondary | ICD-10-CM | POA: Diagnosis not present

## 2016-10-28 NOTE — Progress Notes (Signed)
   PRENATAL VISIT NOTE  Subjective:  Brandi Montgomery is a 26 y.o. G2P1001 at 6166w1d being seen today for ongoing prenatal care.  She is currently monitored for the following issues for this low-risk pregnancy and has Supervision of normal pregnancy on her problem list.  Patient reports no complaints.  Contractions: Not present. Vag. Bleeding: None.  Movement: Present. Denies leaking of fluid.   The following portions of the patient's history were reviewed and updated as appropriate: allergies, current medications, past family history, past medical history, past social history, past surgical history and problem list. Problem list updated.  Objective:   Vitals:   10/28/16 1413  BP: 117/79  Pulse: (!) 102  Weight: 71.2 kg (157 lb)    Fetal Status: Fetal Heart Rate (bpm): 140 Fundal Height: 37 cm Movement: Present     General:  Alert, oriented and cooperative. Patient is in no acute distress.  Skin: Skin is warm and dry. No rash noted.   Cardiovascular: Normal heart rate noted  Respiratory: Normal respiratory effort, no problems with respiration noted  Abdomen: Soft, gravid, appropriate for gestational age. Pain/Pressure: Absent     Pelvic:  Cervical exam deferred        Extremities: Normal range of motion.  Edema: Trace  Mental Status: Normal mood and affect. Normal behavior. Normal judgment and thought content.   Assessment and Plan:  Pregnancy: G2P1001 at 4666w1d  1. Encounter for supervision of other normal pregnancy in third trimester Patient doing well. Feels lots of fetal movements. Reviewed fetal kick counts, when to come to [facility]U.  - Culture, beta strep (group b only) - GC[facility]Chlamydia [facility] amp [facility]  Term labor symptoms and general obstetric precautions including but not limited to vaginal bleeding, contractions, leaking of fluid and fetal movement were reviewed in detail with the patient. Please refer to After Visit Summary for other counseling  recommendations.  Return in about 1 week (around 11/04/2016) for ROB.   Brandi Montgomery, CNM

## 2016-10-28 NOTE — Patient Instructions (Signed)
Introduction Patient Name: ________________________________________________ Patient Due Date: ____________________ What is a fetal movement count? A fetal movement count is the number of times that you feel your baby move during a certain amount of time. This may also be called a fetal kick count. A fetal movement count is recommended for every pregnant woman. You may be asked to start counting fetal movements as early as week 28 of your pregnancy. Pay attention to when your baby is most active. You may notice your baby's sleep and wake cycles. You may also notice things that make your baby move more. You should do a fetal movement count:  When your baby is normally most active.  At the same time each day. A good time to count movements is while you are resting, after having something to eat and drink. How do I count fetal movements? 1. Find a quiet, comfortable area. Sit, or lie down on your side. 2. Write down the date, the start time and stop time, and the number of movements that you felt between those two times. Take this information with you to your health care visits. 3. For 2 hours, count kicks, flutters, swishes, rolls, and jabs. You should feel at least 10 movements during 2 hours. 4. You may stop counting after you have felt 10 movements. 5. If you do not feel 10 movements in 2 hours, have something to eat and drink. Then, keep resting and counting for 1 hour. If you feel at least 4 movements during that hour, you may stop counting. Contact a health care provider if:  You feel fewer than 4 movements in 2 hours.  Your baby is not moving like he or she usually does. Date: ____________ Start time: ____________ Stop time: ____________ Movements: ____________ Date: ____________ Start time: ____________ Stop time: ____________ Movements: ____________ Date: ____________ Start time: ____________ Stop time: ____________ Movements: ____________ Date: ____________ Start time: ____________  Stop time: ____________ Movements: ____________ Date: ____________ Start time: ____________ Stop time: ____________ Movements: ____________ Date: ____________ Start time: ____________ Stop time: ____________ Movements: ____________ Date: ____________ Start time: ____________ Stop time: ____________ Movements: ____________ Date: ____________ Start time: ____________ Stop time: ____________ Movements: ____________ Date: ____________ Start time: ____________ Stop time: ____________ Movements: ____________ This information is not intended to replace advice given to you by your health care provider. Make sure you discuss any questions you have with your health care provider. Document Released: 11/10/2006 Document Revised: 06/09/2016 Document Reviewed: 11/20/2015 Elsevier Interactive Patient Education  2017 [employer]  

## 2016-10-30 LAB — CULTURE, BETA STREP (GROUP B ONLY)

## 2016-11-01 LAB — GC/CHLAMYDIA PROBE AMP (~~LOC~~) NOT AT ARMC
Chlamydia: NEGATIVE
Neisseria Gonorrhea: NEGATIVE

## 2016-11-02 ENCOUNTER — Ambulatory Visit (INDEPENDENT_AMBULATORY_CARE_PROVIDER_SITE_OTHER): Payer: BLUE CROSS/BLUE SHIELD | Admitting: Obstetrics & Gynecology

## 2016-11-02 VITALS — BP 109/72 | HR 87 | Wt 157.0 lb

## 2016-11-02 DIAGNOSIS — Z3483 Encounter for supervision of other normal pregnancy, third trimester: Secondary | ICD-10-CM

## 2016-11-02 MED ORDER — FLUCONAZOLE 150 MG PO TABS: 150.0000 mg | ORAL_TABLET | Freq: Once | ORAL | 3 refills | Status: AC

## 2016-11-02 NOTE — Progress Notes (Signed)
   PRENATAL VISIT NOTE  Subjective:  Brandi Montgomery is a 26 y.o. G2P1001 at 2821w6d being seen today for ongoing prenatal care.  She is currently monitored for the following issues for this low-risk pregnancy and has Supervision of normal pregnancy on her problem list.  Patient reports vaginal irritation.  Contractions: Not present. Vag. Bleeding: None.  Movement: Present. Denies leaking of fluid.   The following portions of the patient's history were reviewed and updated as appropriate: allergies, current medications, past family history, past medical history, past social history, past surgical history and problem list. Problem list updated.  Objective:   Vitals:   11/02/16 1035  BP: 109/72  Pulse: 87  Weight: 157 lb (71.2 kg)    Fetal Status: Fetal Heart Rate (bpm): 160   Movement: Present     General:  Alert, oriented and cooperative. Patient is in no acute distress.  Skin: Skin is warm and dry. No rash noted.   Cardiovascular: Normal heart rate noted  Respiratory: Normal respiratory effort, no problems with respiration noted  Abdomen: Soft, gravid, appropriate for gestational age. Pain/Pressure: Absent     Pelvic:  Cervical exam deferred      , speculum exam reveals lots of yeast, closed appearing cervix  Extremities: Normal range of motion.     Mental Status: Normal mood and affect. Normal behavior. Normal judgment and thought content.   Assessment and Plan:  Pregnancy: G2P1001 at 3921w6d  1. Encounter for supervision of other normal pregnancy in third trimester - Yeast infection- treat with diflucan  Preterm labor symptoms and general obstetric precautions including but not limited to vaginal bleeding, contractions, leaking of fluid and fetal movement were reviewed in detail with the patient. Please refer to After Visit Summary for other counseling recommendations.  No Follow-up on file.   Allie BossierMyra C Miral Hoopes, MD

## 2016-11-07 LAB — OB RESULTS CONSOLE GBS: GBS: NEGATIVE

## 2016-11-11 ENCOUNTER — Telehealth: Payer: Self-pay | Admitting: Radiology

## 2016-11-11 ENCOUNTER — Encounter: Payer: BLUE CROSS/BLUE SHIELD | Admitting: Obstetrics and Gynecology

## 2016-11-11 NOTE — Telephone Encounter (Signed)
Called pt to reschedule appt due to the office being closed because of snow. No answer, left voicemail on home answering machine

## 2016-11-12 ENCOUNTER — Ambulatory Visit (INDEPENDENT_AMBULATORY_CARE_PROVIDER_SITE_OTHER): Payer: BLUE CROSS/BLUE SHIELD | Admitting: Obstetrics & Gynecology

## 2016-11-12 VITALS — BP 134/84 | HR 102 | Wt 160.0 lb

## 2016-11-12 DIAGNOSIS — Z3483 Encounter for supervision of other normal pregnancy, third trimester: Secondary | ICD-10-CM

## 2016-11-12 NOTE — Progress Notes (Signed)
   PRENATAL VISIT NOTE  Subjective:  Brandi Montgomery is a 26 y.o. MW G2P1001 at 2336w2d being seen today for ongoing prenatal care.  She is currently monitored for the following issues for this low-risk pregnancy and has Supervision of normal pregnancy on her problem list.  Patient reports no complaints.  Contractions: Irritability. Vag. Bleeding: None.  Movement: Present. Denies leaking of fluid.   The following portions of the patient's history were reviewed and updated as appropriate: allergies, current medications, past family history, past medical history, past social history, past surgical history and problem list. Problem list updated.  Objective:   Vitals:   11/12/16 0913  BP: 134/84  Pulse: (!) 102  Weight: 160 lb (72.6 kg)    Fetal Status: Fetal Heart Rate (bpm): 140   Movement: Present     General:  Alert, oriented and cooperative. Patient is in no acute distress.  Skin: Skin is warm and dry. No rash noted.   Cardiovascular: Normal heart rate noted  Respiratory: Normal respiratory effort, no problems with respiration noted  Abdomen: Soft, gravid, appropriate for gestational age. Pain/Pressure: Absent     Pelvic:  Cervical exam performed        Extremities: Normal range of motion.  Edema: Trace  Mental Status: Normal mood and affect. Normal behavior. Normal judgment and thought content.   Assessment and Plan:  Pregnancy: G2P1001 at 1336w2d  1. Encounter for supervision of other normal pregnancy in third trimester   Term labor symptoms and general obstetric precautions including but not limited to vaginal bleeding, contractions, leaking of fluid and fetal movement were reviewed in detail with the patient. Please refer to After Visit Summary for other counseling recommendations.  No Follow-up on file.   Allie BossierMyra C Bayron Dalto, MD

## 2016-11-15 ENCOUNTER — Telehealth: Payer: Self-pay | Admitting: *Deleted

## 2016-11-15 NOTE — Telephone Encounter (Signed)
Pt called stating she is experiencing irregular contractions that occur every 6 minutes then stop and happen every 15 minutes as well as lower back pain.  Informed pt that she could be in the early stages of labor and continue to monitor, if they become more regular and start to increase in intensity where she is experiencing them every 3-5 minutes for 1-2 hours straight to call back or go to MAU for evaluation.  Also go to MAU if she experiences a gush of fluid, vaginal bleeding, or baby is not moving.

## 2016-11-17 ENCOUNTER — Observation Stay
Admission: EM | Admit: 2016-11-17 | Discharge: 2016-11-17 | Disposition: A | Discharge status: Home / Self Care | Payer: BLUE CROSS/BLUE SHIELD | Attending: Obstetrics and Gynecology | Admitting: Obstetrics and Gynecology

## 2016-11-17 ENCOUNTER — Ambulatory Visit (INDEPENDENT_AMBULATORY_CARE_PROVIDER_SITE_OTHER): Payer: BLUE CROSS/BLUE SHIELD | Admitting: Family Medicine

## 2016-11-17 ENCOUNTER — Encounter: Payer: Self-pay | Admitting: *Deleted

## 2016-11-17 VITALS — BP 121/83 | HR 91 | Wt 162.0 lb

## 2016-11-17 DIAGNOSIS — O26893 Other specified pregnancy related conditions, third trimester: Secondary | ICD-10-CM | POA: Diagnosis not present

## 2016-11-17 DIAGNOSIS — Z3A40 40 weeks gestation of pregnancy: Secondary | ICD-10-CM | POA: Insufficient documentation

## 2016-11-17 DIAGNOSIS — R252 Cramp and spasm: Secondary | ICD-10-CM | POA: Diagnosis not present

## 2016-11-17 DIAGNOSIS — Z3483 Encounter for supervision of other normal pregnancy, third trimester: Secondary | ICD-10-CM

## 2016-11-17 NOTE — OB Triage Note (Signed)
Recvd pt from ED. C/o cramping in her back. Feeling baby move ok. Rates pain an 8 out of 10.

## 2016-11-17 NOTE — Progress Notes (Signed)
   PRENATAL VISIT NOTE  Subjective:  Brandi Montgomery is a 26 y.o. G2P1001 at 464w0d being seen today for ongoing prenatal care.  She is currently monitored for the following issues for this low-risk pregnancy and has Supervision of normal pregnancy on her problem list.  Patient reports occasional contractions.  Contractions: Irregular. Vag. Bleeding: None.  Movement: Present. Denies leaking of fluid.   The following portions of the patient's history were reviewed and updated as appropriate: allergies, current medications, past family history, past medical history, past social history, past surgical history and problem list. Problem list updated.  Objective:   Vitals:   11/17/16 0951  BP: 121/83  Pulse: 91  Weight: 162 lb (73.5 kg)    Fetal Status: Fetal Heart Rate (bpm): 135 Fundal Height: 39 cm Movement: Present  Presentation: Vertex  General:  Alert, oriented and cooperative. Patient is in no acute distress.  Skin: Skin is warm and dry. No rash noted.   Cardiovascular: Normal heart rate noted  Respiratory: Normal respiratory effort, no problems with respiration noted  Abdomen: Soft, gravid, appropriate for gestational age. Pain/Pressure: Present     Pelvic:  Cervical exam performed Dilation: 3 Effacement (%): 90 Station: -2  Extremities: Normal range of motion.  Edema: Trace  Mental Status: Normal mood and affect. Normal behavior. Normal judgment and thought content.   Assessment and Plan:  Pregnancy: G2P1001 at 7664w0d  1. Encounter for supervision of other normal pregnancy in third trimester Swept membranes today, tolerated well. Offered again at 40wkx3. Has PDIOL scheduled. Reviewed labor precautions.   Term labor symptoms and general obstetric precautions including but not limited to vaginal bleeding, contractions, leaking of fluid and fetal movement were reviewed in detail with the patient. Please refer to After Visit Summary for other counseling recommendations.   Return  in about 1 week (around 11/24/2016) for Routine prenatal care.  Future Appointments Date Time Provider Department Center  11/24/2016 10:00 AM Tereso NewcomerUgonna A Anyanwu, MD [facility]  12/30/2016 10:00 AM Myra Inda Coke Dove, MD [facility]   Federico FlakeKimberly Niles Jahlen Bollman, MD

## 2016-11-17 NOTE — Discharge Instructions (Signed)
Come back if: ° °Big gush of fluid °Temp over 100.4 °Heavy vaginal bleeding °Decreased fetal movement °Contractions every 3-5 min lasting at least one hour ° °Get plenty of rest and stay well hydrated! °

## 2016-11-17 NOTE — Patient Instructions (Signed)
Come to the MAU (maternity admission unit) for 1) Strong contractions every 2-3 minutes for at least 1 hour that do not go away when you drink water or take a warm shower. These contractions will be so strong all you can do is breath through them 2) Vaginal bleeding- anything more than spotting 3) Loss of fluid like you broke your water 4) Decreased movement of your baby    Labor Induction Labor induction is when steps are taken to cause a pregnant woman to begin the labor process. Most women go into labor on their own between 37 weeks and 42 weeks of the pregnancy. When this does not happen or when there is a medical need, methods may be used to induce labor. Labor induction causes a pregnant woman's uterus to contract. It also causes the cervix to soften (ripen), open (dilate), and thin out (efface). Usually, labor is not induced before 39 weeks of the pregnancy unless there is a problem with the baby or mother. Before inducing labor, your health care provider will consider a number of factors, including the following:  The medical condition of you and the baby.  How many weeks along you are.  The status of the baby's lung maturity.  The condition of the cervix.  The position of the baby. What are the reasons for labor induction? Labor may be induced for the following reasons:  The health of the baby or mother is at risk.  The pregnancy is overdue by 1 week or more.  The water breaks but labor does not start on its own.  The mother has a health condition or serious illness, such as high blood pressure, infection, placental abruption, or diabetes.  The amniotic fluid amounts are low around the baby.  The baby is distressed. Convenience or wanting the baby to be born on a certain date is not a reason for inducing labor. What methods are used for labor induction? Several methods of labor induction may be used, such as:  Prostaglandin medicine. This medicine causes the cervix to  dilate and ripen. The medicine will also start contractions. It can be taken by mouth or by inserting a suppository into the vagina.  Inserting a thin tube (catheter) with a balloon on the end into the vagina to dilate the cervix. Once inserted, the balloon is expanded with water, which causes the cervix to open.  Stripping the membranes. Your health care provider separates amniotic sac tissue from the cervix, causing the cervix to be stretched and causing the release of a hormone called progesterone. This may cause the uterus to contract. It is often done during an office visit. You will be sent home to wait for the contractions to begin. You will then come in for an induction.  Breaking the water. Your health care provider makes a hole in the amniotic sac using a small instrument. Once the amniotic sac breaks, contractions should begin. This may still take hours to see an effect.  Medicine to trigger or strengthen contractions. This medicine is given through an IV access tube inserted into a vein in your arm. All of the methods of induction, besides stripping the membranes, will be done in the hospital. Induction is done in the hospital so that you and the baby can be carefully monitored. How long does it take for labor to be induced? Some inductions can take up to 2-3 days. Depending on the cervix, it usually takes less time. It takes longer when you are induced early in   the pregnancy or if this is your first pregnancy. If a mother is still pregnant and the induction has been going on for 2-3 days, either the mother will be sent home or a cesarean delivery will be needed. What are the risks associated with labor induction? Some of the risks of induction include:  Changes in fetal heart rate, such as too high, too low, or erratic.  Fetal distress.  Chance of infection for the mother and baby.  Increased chance of having a cesarean delivery.  Breaking off (abruption) of the placenta from the  uterus (rare).  Uterine rupture (very rare). When induction is needed for medical reasons, the benefits of induction may outweigh the risks. What are some reasons for not inducing labor? Labor induction should not be done if:  It is shown that your baby does not tolerate labor.  You have had previous surgeries on your uterus, such as a myomectomy or the removal of fibroids.  Your placenta lies very low in the uterus and blocks the opening of the cervix (placenta previa).  Your baby is not in a head-down position.  The umbilical cord drops down into the birth canal in front of the baby. This could cut off the baby's blood and oxygen supply.  You have had a previous cesarean delivery.  There are unusual circumstances, such as the baby being extremely premature. This information is not intended to replace advice given to you by your health care provider. Make sure you discuss any questions you have with your health care provider. Document Released: 03/02/2007 Document Revised: 03/18/2016 Document Reviewed: 05/10/2013 Elsevier Interactive Patient Education  2017 Elsevier Inc.  

## 2016-11-19 NOTE — Discharge Summary (Signed)
  Pt evaled on L+D by RN , reactive NST and not in labor . D/C home

## 2016-11-24 ENCOUNTER — Telehealth (HOSPITAL_COMMUNITY): Payer: Self-pay | Admitting: *Deleted

## 2016-11-24 ENCOUNTER — Encounter (HOSPITAL_COMMUNITY): Payer: Self-pay | Admitting: *Deleted

## 2016-11-24 ENCOUNTER — Ambulatory Visit (INDEPENDENT_AMBULATORY_CARE_PROVIDER_SITE_OTHER): Payer: BLUE CROSS/BLUE SHIELD | Admitting: Obstetrics & Gynecology

## 2016-11-24 VITALS — BP 120/78 | HR 85 | Wt 165.0 lb

## 2016-11-24 DIAGNOSIS — O480 Post-term pregnancy: Secondary | ICD-10-CM | POA: Diagnosis not present

## 2016-11-24 DIAGNOSIS — Z3483 Encounter for supervision of other normal pregnancy, third trimester: Secondary | ICD-10-CM

## 2016-11-24 DIAGNOSIS — Z3689 Encounter for other specified antenatal screening: Secondary | ICD-10-CM

## 2016-11-24 NOTE — Telephone Encounter (Signed)
Preadmission screen  

## 2016-11-24 NOTE — Patient Instructions (Signed)
Return to clinic for any scheduled appointments or obstetric concerns, or go to [facility]U for evaluation  

## 2016-11-24 NOTE — Progress Notes (Signed)
   PRENATAL VISIT NOTE  Subjective:  Brandi Montgomery is a 26 y.o. G2P1001 at 6674w0d being seen today for ongoing prenatal care.  She is currently monitored for the following issues for this low-risk pregnancy and has Supervision of normal pregnancy on her problem list.  Patient reports no complaints.  Contractions: Irregular. Vag. Bleeding: None.  Movement: Present. Denies leaking of fluid.   The following portions of the patient's history were reviewed and updated as appropriate: allergies, current medications, past family history, past medical history, past social history, past surgical history and problem list. Problem list updated.  Objective:   Vitals:   11/24/16 1018  BP: 120/78  Pulse: 85  Weight: 165 lb (74.8 kg)    Fetal Status: Fetal Heart Rate (bpm): 146 Fundal Height: 40 cm Movement: Present  Presentation: Vertex  General:  Alert, oriented and cooperative. Patient is in no acute distress.  Skin: Skin is warm and dry. No rash noted.   Cardiovascular: Normal heart rate noted  Respiratory: Normal respiratory effort, no problems with respiration noted  Abdomen: Soft, gravid, appropriate for gestational age. Pain/Pressure: Present     Pelvic:  Cervical exam performed Dilation: 3 Effacement (%): 90 Station: -2  Extremities: Normal range of motion.  Edema: Trace  Mental Status: Normal mood and affect. Normal behavior. Normal judgment and thought content.     Assessment and Plan:  Pregnancy: G2P1001 at 4674w0d  1. Post term pregnancy over 40 weeks NST performed today was reviewed and was found to be reactive.  AFI was also normal.  Continue recommended antenatal testing and prenatal care. - Fetal nonstress test - Amniotic fluid index IOL scheduled for next week.  2. Encounter for supervision of other normal pregnancy in third trimester Preterm labor symptoms and general obstetric precautions including but not limited to vaginal bleeding, contractions, leaking of fluid and  fetal movement were reviewed in detail with the patient. Please refer to After Visit Summary for other counseling recommendations.  Return in about 5 days (around 11/29/2016) for NST only. Then 6 weeks - Postpartum Visit.   Tereso NewcomerUgonna A Breya Cass, MD

## 2016-11-29 ENCOUNTER — Ambulatory Visit (INDEPENDENT_AMBULATORY_CARE_PROVIDER_SITE_OTHER): Payer: BLUE CROSS/BLUE SHIELD | Admitting: *Deleted

## 2016-11-29 ENCOUNTER — Encounter: Payer: Self-pay | Admitting: *Deleted

## 2016-11-29 VITALS — BP 117/76 | HR 96

## 2016-11-29 DIAGNOSIS — Z3689 Encounter for other specified antenatal screening: Secondary | ICD-10-CM | POA: Diagnosis not present

## 2016-11-29 DIAGNOSIS — O480 Post-term pregnancy: Secondary | ICD-10-CM | POA: Diagnosis not present

## 2016-11-29 DIAGNOSIS — Z3483 Encounter for supervision of other normal pregnancy, third trimester: Secondary | ICD-10-CM

## 2016-12-01 ENCOUNTER — Inpatient Hospital Stay (HOSPITAL_COMMUNITY): Payer: BLUE CROSS/BLUE SHIELD | Admitting: Anesthesiology

## 2016-12-01 ENCOUNTER — Encounter (HOSPITAL_COMMUNITY): Payer: Self-pay

## 2016-12-01 ENCOUNTER — Inpatient Hospital Stay (HOSPITAL_COMMUNITY)
Admission: RE | Admit: 2016-12-01 | Discharge: 2016-12-03 | DRG: 775 | Disposition: A | Discharge status: Home / Self Care | Payer: BLUE CROSS/BLUE SHIELD | Source: Ambulatory Visit | Attending: Family Medicine | Admitting: Family Medicine

## 2016-12-01 DIAGNOSIS — O480 Post-term pregnancy: Secondary | ICD-10-CM | POA: Diagnosis present

## 2016-12-01 DIAGNOSIS — Z3A41 41 weeks gestation of pregnancy: Secondary | ICD-10-CM

## 2016-12-01 DIAGNOSIS — Z823 Family history of stroke: Secondary | ICD-10-CM | POA: Diagnosis not present

## 2016-12-01 HISTORY — DX: Chronic kidney disease, unspecified: N18.9

## 2016-12-01 LAB — TYPE AND SCREEN
ABO/RH(D): A POS
ANTIBODY SCREEN: NEGATIVE

## 2016-12-01 LAB — CBC
HCT: 32.4 % — ABNORMAL LOW (ref 36.0–46.0)
Hemoglobin: 10.9 g/dL — ABNORMAL LOW (ref 12.0–15.0)
MCH: 26.3 pg (ref 26.0–34.0)
MCHC: 33.6 g/dL (ref 30.0–36.0)
MCV: 78.1 fL (ref 78.0–100.0)
Platelets: 247 10*3/uL (ref 150–400)
RBC: 4.15 MIL/uL (ref 3.87–5.11)
RDW: 14 % (ref 11.5–15.5)
WBC: 12.5 10*3/uL — ABNORMAL HIGH (ref 4.0–10.5)

## 2016-12-01 LAB — ABO/RH: ABO/RH(D): A POS

## 2016-12-01 MED ORDER — LACTATED RINGERS IV SOLN: 500.0000 mL | Freq: Once | INTRAVENOUS | Status: DC

## 2016-12-01 MED ORDER — SOD CITRATE-CITRIC ACID 500-334 MG/5ML PO SOLN: 30.0000 mL | ORAL | Status: DC | PRN

## 2016-12-01 MED ORDER — LIDOCAINE HCL (PF) 1 % IJ SOLN
INTRAMUSCULAR | Status: DC | PRN
  Administered 2016-12-01: 6 mL via EPIDURAL
  Administered 2016-12-01: 7 mL via EPIDURAL

## 2016-12-01 MED ORDER — FENTANYL CITRATE (PF) 100 MCG/2ML IJ SOLN: 50.0000 ug | INTRAMUSCULAR | Status: DC | PRN

## 2016-12-01 MED ORDER — FLEET ENEMA 7-19 GM/118ML RE ENEM: 1.0000 | ENEMA | RECTAL | Status: DC | PRN

## 2016-12-01 MED ORDER — FENTANYL 2.5 MCG/ML BUPIVACAINE 1/10 % EPIDURAL INFUSION (WH - ANES)
14.0000 mL/h | INTRAMUSCULAR | Status: DC | PRN
  Administered 2016-12-01 (×2): 14 mL/h via EPIDURAL
  Filled 2016-12-01 (×2): qty 100

## 2016-12-01 MED ORDER — EPHEDRINE 5 MG/ML INJ
10.0000 mg | INTRAVENOUS | Status: DC | PRN
  Filled 2016-12-01: qty 4

## 2016-12-01 MED ORDER — LIDOCAINE HCL (PF) 1 % IJ SOLN
30.0000 mL | INTRAMUSCULAR | Status: DC | PRN
  Filled 2016-12-01: qty 30

## 2016-12-01 MED ORDER — LACTATED RINGERS IV SOLN: 500.0000 mL | INTRAVENOUS | Status: DC | PRN

## 2016-12-01 MED ORDER — OXYCODONE-ACETAMINOPHEN 5-325 MG PO TABS: 1.0000 | ORAL_TABLET | ORAL | Status: DC | PRN

## 2016-12-01 MED ORDER — OXYTOCIN 40 UNITS IN LACTATED RINGERS INFUSION - SIMPLE MED
1.0000 m[IU]/min | INTRAVENOUS | Status: DC
  Administered 2016-12-01: 2 m[IU]/min via INTRAVENOUS
  Filled 2016-12-01: qty 1000

## 2016-12-01 MED ORDER — DIPHENHYDRAMINE HCL 50 MG/ML IJ SOLN: 12.5000 mg | INTRAMUSCULAR | Status: DC | PRN

## 2016-12-01 MED ORDER — MISOPROSTOL 25 MCG QUARTER TABLET
25.0000 ug | ORAL_TABLET | Freq: Once | ORAL | Status: AC
  Administered 2016-12-01: 25 ug via VAGINAL
  Filled 2016-12-01: qty 0.25

## 2016-12-01 MED ORDER — PHENYLEPHRINE 40 MCG/ML (10ML) SYRINGE FOR IV PUSH (FOR BLOOD PRESSURE SUPPORT)
80.0000 ug | PREFILLED_SYRINGE | INTRAVENOUS | Status: DC | PRN
  Filled 2016-12-01: qty 5
  Filled 2016-12-01: qty 10

## 2016-12-01 MED ORDER — PHENYLEPHRINE 40 MCG/ML (10ML) SYRINGE FOR IV PUSH (FOR BLOOD PRESSURE SUPPORT)
80.0000 ug | PREFILLED_SYRINGE | INTRAVENOUS | Status: DC | PRN
  Filled 2016-12-01: qty 10
  Filled 2016-12-01: qty 5

## 2016-12-01 MED ORDER — ACETAMINOPHEN 325 MG PO TABS
650.0000 mg | ORAL_TABLET | ORAL | Status: DC | PRN
  Filled 2016-12-01 (×3): qty 2

## 2016-12-01 MED ORDER — OXYTOCIN BOLUS FROM INFUSION
500.0000 mL | Freq: Once | INTRAVENOUS | Status: AC
  Administered 2016-12-01: 500 mL via INTRAVENOUS

## 2016-12-01 MED ORDER — TERBUTALINE SULFATE 1 MG/ML IJ SOLN
0.2500 mg | Freq: Once | INTRAMUSCULAR | Status: DC | PRN
  Filled 2016-12-01: qty 1

## 2016-12-01 MED ORDER — ONDANSETRON HCL 4 MG/2ML IJ SOLN: 4.0000 mg | Freq: Four times a day (QID) | INTRAMUSCULAR | Status: DC | PRN

## 2016-12-01 MED ORDER — LACTATED RINGERS IV SOLN
INTRAVENOUS | Status: DC
  Administered 2016-12-01: 13:00:00 via INTRAVENOUS
  Administered 2016-12-01 (×2): 125 mL/h via INTRAVENOUS

## 2016-12-01 MED ORDER — OXYCODONE-ACETAMINOPHEN 5-325 MG PO TABS: 2.0000 | ORAL_TABLET | ORAL | Status: DC | PRN

## 2016-12-01 MED ORDER — OXYTOCIN 40 UNITS IN LACTATED RINGERS INFUSION - SIMPLE MED
2.5000 [IU]/h | INTRAVENOUS | Status: DC
  Administered 2016-12-01: 2.5 [IU]/h via INTRAVENOUS
  Filled 2016-12-01: qty 1000

## 2016-12-01 NOTE — Anesthesia Preprocedure Evaluation (Signed)
Anesthesia Evaluation  Patient identified by MRN, date of birth, ID band Patient awake    Reviewed: Allergy & Precautions, H&P , NPO status , Patient's Chart, lab work & pertinent test results  Airway Mallampati: I  TM Distance: >3 FB Neck ROM: full    Dental no notable dental hx.    Pulmonary neg pulmonary ROS,    Pulmonary exam normal        Cardiovascular negative cardio ROS Normal cardiovascular exam     Neuro/Psych negative neurological ROS  negative psych ROS   GI/Hepatic negative GI ROS, Neg liver ROS,   Endo/Other  negative endocrine ROS  Renal/GU      Musculoskeletal   Abdominal Normal abdominal exam  (+)   Peds  Hematology negative hematology ROS (+)   Anesthesia Other Findings   Reproductive/Obstetrics (+) Pregnancy                             Anesthesia Physical Anesthesia Plan  ASA: II  Anesthesia Plan: Epidural   Post-op Pain Management:    Induction:   Airway Management Planned:   Additional Equipment:   Intra-op Plan:   Post-operative Plan:   Informed Consent: I have reviewed the patients History and Physical, chart, labs and discussed the procedure including the risks, benefits and alternatives for the proposed anesthesia with the patient or authorized representative who has indicated his/her understanding and acceptance.     Plan Discussed with:   Anesthesia Plan Comments:         Anesthesia Quick Evaluation

## 2016-12-01 NOTE — Progress Notes (Signed)
Brandi Montgomery is a 26 yo G2P1001 at 2855w0d here for IOL 2/2 postdates.  S: Patient seen & examined for progress of labor. Patient comfortable with epidural.   O:  Vitals:   12/01/16 1441 12/01/16 1501 12/01/16 1531 12/01/16 1601  BP: 136/90 (!) 105/58 127/82 124/80  Pulse: 93 85 (!) 104 93  Resp: 16 16 16 16   Temp:      TempSrc:      SpO2:      Weight:      Height:        Dilation: 4.5 Effacement (%): 80 Station: -2 Presentation: Vertex Exam by:: Doloris HallJenny Middleton RN   FHT: baseline 150s bpm, mod var, +accels, no decels TOCO: q 2min, contractions not tracing well when patient is resting on her side.   A/P: Pt comfortable and continuing to progress.  Pitocin increased to 6 milliunits/min.  Will continue to monitor and consider AROM. Continue expectant management Anticipate SVD   Con MemosSung W [facility] LP[facility], Medical Student 12/01/2016 4:34 PM   OB FELLOW MEDICAL STUDENT NOTE ATTESTATION  I have seen and examined this patient. Note this is a Psychologist, occupationalmedical student note and as such does not necessarily reflect the patient's plan of care. Please see Ernestina Pennaicholas Schenk MD's note for this date of service.    Ernestina Pennaicholas Schenk 12/01/2016, 6:02 PM

## 2016-12-01 NOTE — Anesthesia Pain Management Evaluation Note (Signed)
  CRNA Pain Management Visit Note  Patient: Brandi Montgomery, 26 y.o., female  "Hello I am a [facility] the [facility]. We have an anesthesia team available at all times to provide care throughout the hospital, including epidural management and anesthesia for C-section. I don't know your plan for the delivery whether it a natural birth, water birth, IV sedation, nitrous supplementation, doula or epidural, but we want to meet your pain goals."   1.Was your pain managed to your expectations on prior hospitalizations?   Yes   2.What is your expectation for pain management during this hospitalization?     Epidural  3.How can we help you reach that goal?   Record the patient's initial score and the patient's pain goal.   Pain: 0  Pain Goal: 4 [facility] wants you to be able to say your pain was always managed very well.  Laban EmperorMalinova,Katilyn Miltenberger Hristova 12/01/2016

## 2016-12-01 NOTE — Progress Notes (Signed)
Brandi Montgomery is a 26 yo G2P1001 at 3937w0d here for IOL 2/2 post dates.  S: Patient seen & examined for progress of labor. Patient comfortable, epidural planned.  O:  Vitals:   12/01/16 0749 12/01/16 1018 12/01/16 1128  BP: 131/85  120/78  Pulse: (!) 108  87  Resp: 16 16 18   Temp: 98.3 F (36.8 C)  97.6 F (36.4 C)  TempSrc: Oral  Oral  Weight: 72.6 kg (160 lb)    Height: 5\' 4"  (1.626 m)      Dilation: 3.5 Effacement (%): 80 Station: -3 Presentation: Vertex Exam by:: Doloris HallJenny Middleton RN   FHT: baseline 140s bpm, mod var, no accels, no decels TOCO: q 3min   A/P: Pt comfortable with regular contractions.  FHT cat I. Epidural planned Continue expectant management Anticipate SVD   Satira AnisSung W Park, Medical Student 12/01/2016 12:24 PM   OB FELLOW MEDICAL STUDENT NOTE ATTESTATION  I have seen and examined this patient. Note this is a Psychologist, occupationalmedical student note and as such does not necessarily reflect the patient's plan of care. Please see Ernestina Pennaicholas Claudia Greenley MD's note for this date of service.    Ernestina Pennaicholas Cole Eastridge 12/01/2016, 1:45 PM

## 2016-12-01 NOTE — Progress Notes (Signed)
Patient ID: Brandi Montgomery, female   DOB: 12/27/1990, 26 y.o.   MRN: [id number]  Mostly comfortable w/ epidural; feeling some pressure but bearable VSS, afeb FHR 135-145s, +accels, no decels, occ mi variables Ctx q 2-3 mins w/ Pit @ 378mu/min Cx recently examined: ant lip/vtx +1  IUP@term  Transition  Will examine cx in 1-2 hrs or sooner prn pressure Anticipate SVD  Tiasha Helvie CNM 12/01/2016 9:22 PM

## 2016-12-01 NOTE — H&P (Signed)
LABOR AND DELIVERY ADMISSION HISTORY AND PHYSICAL NOTE  Brandi Montgomery is a 26 y.o. female G2P1001 with IUP at 1951w0d by LMP presenting for IOL for postdates. Denies HA, changes in vision, CP, SOB, NV or diarrhea. She reports positive fetal movement. She denies leakage of fluid or vaginal bleeding.  Prenatal History/Complications:  Past Medical History: Past Medical History:  Diagnosis Date  . Kidney stone     Past Surgical History: Past Surgical History:  Procedure Laterality Date  . ANKLE SURGERY Left 2005    Obstetrical History: OB History    Gravida Para Term Preterm AB Living   2 1 1  0 0 1   SAB TAB Ectopic Multiple Live Births   0 0 0 0 1      Social History: Social History   Social History  . Marital status: Single    Spouse name: N/A  . Number of children: N/A  . Years of education: N/A   Social History Main Topics  . Smoking status: Never Smoker  . Smokeless tobacco: Never Used  . Alcohol use No  . Drug use: No  . Sexual activity: Yes    Partners: Male    Birth control/ protection: Condom   Other Topics Concern  . Not on file   Social History Narrative   Divorced   High school   Works as a Economistserver   Exercises          Family History: Family History  Problem Relation Age of Onset  . Stroke Mother 30    Blood clot related to diet pills.  . Cancer Maternal Grandmother [id number]    Breast cancer    Allergies: Allergies  Allergen Reactions  . Codeine Other (See Comments)    unknown    Prescriptions Prior to Admission  Medication Sig Dispense Refill Last Dose  . Pediatric Multiple Vit-C-FA (FLINSTONES GUMMIES OMEGA-3 DHA) CHEW Chew by mouth.   Taking     Review of Systems   All systems reviewed and negative except as stated in HPI  Last menstrual period 02/18/2016. General appearance: alert and cooperative Lungs: clear to auscultation bilaterally Heart: regular rate and rhythm Abdomen: soft, non-tender; bowel sounds  normal Extremities: No calf swelling or tenderness Presentation: cephalic Fetal monitoring: baseline 125, mod variability, pos accels, no decels, irregular contractions Uterine activity:       Prenatal labs: ABO, Rh:  A pos Antibody:  neg Rubella: immune RPR: NON REAC (11/08 0917)  HBsAg: NEGATIVE (12/04 1447)  HIV: NONREACTIVE (11/08 0917)  GBS: Negative (01/14 0000)  1 hr Glucola: normal 2hr third Genetic screening:  Too late due to insurance Anatomy US: normal  Prenatal Transfer Tool  Maternal Diabetes: No Genetic Screening: Normal Maternal Ultrasounds/Referrals: Normal Fetal Ultrasounds or other Referrals:  None Maternal Substance Abuse:  No Significant Maternal Medications:  None Significant Maternal Lab Results: None  No results found for this or any previous visit (from the past 24 hour(s)).  Patient Active Problem List   Diagnosis Date Noted  . Supervision of normal pregnancy 07/01/2016    Assessment: Brandi Montgomery is a 26 y.o. G2P1001 at 2651w0d here for IOL for postdates. No leakage of fluids, vaginal bleeding or contractions. No complications during pregnancy  #Labor: IOL for posdates. Plan 25ug vaginal cytotec and start pitocin 2x2 after 4 hours. Anticipate SVd #Pain: epidural #FWB:  Cat 1 #ID: GBS neg #MOF: Breast #MOC: IUD/Depo #Circ:  Yes  Renne Muscaaniel L Warden, MD PGY-1 12/01/2016, 7:34 AM  OB  FELLOW HISTORY AND PHYSICAL ATTESTATION  I have seen and examined this patient; I agree with above documentation in the resident's note.    Ernestina Penna 12/01/2016, 11:15 AM

## 2016-12-01 NOTE — Anesthesia Procedure Notes (Signed)
Epidural Patient location during procedure: OB Start time: 12/01/2016 2:02 PM End time: 12/01/2016 2:06 PM  Staffing Anesthesiologist: Leilani AbleHATCHETT, Jurrell Royster Performed: anesthesiologist   Preanesthetic Checklist Completed: patient identified, surgical consent, pre-op evaluation, timeout performed, IV checked, risks and benefits discussed and monitors and equipment checked  Epidural Patient position: sitting Prep: site prepped and draped and DuraPrep Patient monitoring: continuous pulse ox and blood pressure Approach: midline Location: L3-L4 Injection technique: LOR air  Needle:  Needle type: Tuohy  Needle gauge: 17 G Needle length: 9 cm and 9 Needle insertion depth: 5 cm cm Catheter type: closed end flexible Catheter size: 19 Gauge Catheter at skin depth: 10 cm Test dose: negative and Other  Assessment Sensory level: T9 Events: blood not aspirated, injection not painful, no injection resistance, negative IV test and no paresthesia  Additional Notes Reason for block:procedure for pain

## 2016-12-01 NOTE — Progress Notes (Signed)
Patient seen doing well. No concerns. AROM for clear fluid. 5/90/-1. Category 1 tracing.

## 2016-12-02 LAB — RPR: RPR: NONREACTIVE

## 2016-12-02 MED ORDER — PRENATAL MULTIVITAMIN CH
1.0000 | ORAL_TABLET | Freq: Every day | ORAL | Status: DC
  Administered 2016-12-02: 1 via ORAL
  Filled 2016-12-02: qty 1

## 2016-12-02 MED ORDER — WITCH HAZEL-GLYCERIN EX PADS: 1.0000 "application " | MEDICATED_PAD | CUTANEOUS | Status: DC | PRN

## 2016-12-02 MED ORDER — COCONUT OIL OIL: 1.0000 "application " | TOPICAL_OIL | Status: DC | PRN

## 2016-12-02 MED ORDER — ACETAMINOPHEN 325 MG PO TABS
650.0000 mg | ORAL_TABLET | ORAL | Status: DC | PRN
  Administered 2016-12-02 – 2016-12-03 (×3): 650 mg via ORAL

## 2016-12-02 MED ORDER — DIPHENHYDRAMINE HCL 25 MG PO CAPS: 25.0000 mg | ORAL_CAPSULE | Freq: Four times a day (QID) | ORAL | Status: DC | PRN

## 2016-12-02 MED ORDER — BENZOCAINE-MENTHOL 20-0.5 % EX AERO
1.0000 "application " | INHALATION_SPRAY | CUTANEOUS | Status: DC | PRN
  Filled 2016-12-02: qty 56

## 2016-12-02 MED ORDER — ONDANSETRON HCL 4 MG/2ML IJ SOLN
4.0000 mg | INTRAMUSCULAR | Status: DC | PRN
Start: 2016-12-02 — End: 2016-12-03

## 2016-12-02 MED ORDER — ONDANSETRON HCL 4 MG PO TABS: 4.0000 mg | ORAL_TABLET | ORAL | Status: DC | PRN

## 2016-12-02 MED ORDER — OXYCODONE HCL 5 MG PO TABS: 5.0000 mg | ORAL_TABLET | ORAL | Status: DC | PRN

## 2016-12-02 MED ORDER — DIBUCAINE 1 % RE OINT: 1.0000 "application " | TOPICAL_OINTMENT | RECTAL | Status: DC | PRN

## 2016-12-02 MED ORDER — IBUPROFEN 600 MG PO TABS
600.0000 mg | ORAL_TABLET | Freq: Four times a day (QID) | ORAL | Status: DC
  Administered 2016-12-02 – 2016-12-03 (×5): 600 mg via ORAL
  Filled 2016-12-02 (×5): qty 1

## 2016-12-02 MED ORDER — SENNOSIDES-DOCUSATE SODIUM 8.6-50 MG PO TABS
2.0000 | ORAL_TABLET | ORAL | Status: DC
  Administered 2016-12-03: 2 via ORAL
  Filled 2016-12-02: qty 2

## 2016-12-02 MED ORDER — ZOLPIDEM TARTRATE 5 MG PO TABS: 5.0000 mg | ORAL_TABLET | Freq: Every evening | ORAL | Status: DC | PRN

## 2016-12-02 MED ORDER — TETANUS-DIPHTH-ACELL PERTUSSIS 5-2.5-18.5 LF-MCG/0.5 IM SUSP
0.5000 mL | Freq: Once | INTRAMUSCULAR | Status: DC
Start: 2016-12-02 — End: 2016-12-03

## 2016-12-02 MED ORDER — SIMETHICONE 80 MG PO CHEW: 80.0000 mg | CHEWABLE_TABLET | ORAL | Status: DC | PRN

## 2016-12-02 NOTE — Progress Notes (Signed)
Post Partum Day #1 Subjective: no complaints, up ad lib and tolerating PO; breastfeeding going well; desires Depo for contraception  Objective: Blood pressure 108/60, pulse 96, temperature 98.5 F (36.9 C), temperature source Oral, resp. rate 18, height 5\' 4"  (1.626 m), weight 72.6 kg (160 lb), last menstrual period 02/18/2016, SpO2 99 %, unknown if currently breastfeeding.  Physical Exam:  General: alert, cooperative and no distress Lochia: appropriate Uterine Fundus: firm DVT Evaluation: No evidence of DVT seen on physical exam.   Recent Labs  12/01/16 0750  HGB 10.9*  HCT 32.4*    Assessment/Plan: Plan for discharge tomorrow   LOS: 1 day   Cam HaiSHAW, Taci Sterling CNM 12/02/2016, 7:49 AM

## 2016-12-02 NOTE — Lactation Note (Signed)
This note was copied from a baby's chart. Lactation Consultation Note  Patient Name: Brandi Montgomery Today's Date: 12/02/2016 Reason for consult: Initial assessment Mom reports she thinks baby is latching well but does report nipple tenderness. Basic teaching reviewed with Mom. Encouraged to continue to BF with feeding ques. Advised to apply EBM to sore nipples. Lactation brochure left for review. Advised of OP services and support group. Encouraged to call with next feeding for LC to review hand expression and assist with latch.   Maternal Data Does the patient have breastfeeding experience prior to this delivery?: No  Feeding Feeding Type: Breast Fed Length of feed: 30 min  LATCH Score/Interventions                      Lactation Tools Discussed/Used     Consult Status Consult Status: Follow-up Date: 12/02/16 Follow-up type: In-patient    Brandi Montgomery, Brandi Montgomery 12/02/2016, 2:06 PM

## 2016-12-02 NOTE — Discharge Instructions (Signed)

## 2016-12-02 NOTE — Anesthesia Postprocedure Evaluation (Signed)
Anesthesia Post Note  Patient: Brandi Montgomery  Procedure(s) Performed: * No procedures listed *  Patient location during evaluation: Mother Baby Anesthesia Type: Epidural Level of consciousness: awake and alert and oriented Pain management: pain level controlled Vital Signs Assessment: post-procedure vital signs reviewed and stable Respiratory status: spontaneous breathing and nonlabored ventilation Cardiovascular status: stable Postop Assessment: no headache, no backache, epidural receding, patient able to bend at knees, no signs of nausea or vomiting and adequate PO intake Anesthetic complications: no        Last Vitals:  Vitals:   12/02/16 0101 12/02/16 0500  BP: 119/71 108/60  Pulse: 92 96  Resp: 18 18  Temp: 37.3 C 36.9 C    Last Pain:  Vitals:   12/02/16 0500  TempSrc: Oral  PainSc: 2    Pain Goal:                 Brandi Montgomery,Brandi Montgomery

## 2016-12-02 NOTE — Discharge Summary (Signed)
OB Discharge Summary     Patient Name: Brandi Montgomery DOB: 10/13/1991 MRN: [id number]  Date of admission: 12/01/2016 Delivering MD: Cam HaiSHAW, KIMBERLY D   Date of discharge: 12/03/2016  Admitting diagnosis: 42 week induction Intrauterine pregnancy: 7145w0d     Secondary diagnosis:  Active Problems:   Post-dates pregnancy  Additional problems: none     Discharge diagnosis: Term Pregnancy Delivered                                                                                                Post partum procedures:n/a  Augmentation: AROM, Pitocin and Cytotec  Complications: None  Hospital course:  Induction of Labor With Vaginal Delivery   26 y.o. yo [id number] at 2645w0d was admitted to the hospital 12/01/2016 for induction of labor.  Indication for induction: Postdates.  Patient had an uncomplicated labor course as follows: Membrane Rupture Time/Date: 7:27 PM ,12/01/2016   Intrapartum Procedures: Episiotomy: None [1]                                         Lacerations:  None [1]  Patient had delivery of a Viable infant.  Information for the patient's newborn:  Montgomery, Boy Brandi [[id number]][[id number]]  Delivery Method: Vaginal, Spontaneous Delivery (Filed from Delivery Summary)   12/01/2016  Details of delivery can be found in separate delivery note.  Patient had a routine postpartum course. Patient is discharged home 12/03/16.  Physical exam  Vitals:   12/02/16 0500 12/02/16 0900 12/02/16 1911 12/03/16 0530  BP: 108/60 120/82 106/71 117/70  Pulse: 96 74 73 67  Resp: 18 18 18 18   Temp: 98.5 F (36.9 C) 97.8 F (36.6 C) 98.2 F (36.8 C) 98 F (36.7 C)  TempSrc: Oral Oral Oral Oral  SpO2:      Weight:      Height:       General: alert, cooperative and no distress Lochia: appropriate Uterine Fundus: firm Incision: N/A DVT Evaluation: No evidence of DVT seen on physical exam. Negative Homan's sign. No significant calf/ankle edema. Labs: Lab Results  Component Value Date   WBC 12.5  (H) 12/01/2016   HGB 10.9 (L) 12/01/2016   HCT 32.4 (L) 12/01/2016   MCV 78.1 12/01/2016   PLT 247 12/01/2016   CMP Latest Ref Rng & Units 03/02/2014  Glucose 65 - 99 mg/dL 84  BUN 7 - 18 mg/dL 8  Creatinine 9.520.60 - 8.411.30 mg/dL 3.240.74  Sodium 401136 - 027145 mmol/L 134(L)  Potassium 3.5 - 5.1 mmol/L 3.2(L)  Chloride 98 - 107 mmol/L 99  CO2 21 - 32 mmol/L 26  Calcium 8.5 - 10.1 mg/dL 9.3  Total Protein 6.4 - 8.2 g/dL 2.5(D8.4(H)  Total Bilirubin 0.2 - 1.0 mg/dL 0.5  Alkaline Phos Unit/L 67  AST 15 - 37 Unit/L 12(L)  ALT 12 - 78 U/L 20    Discharge instruction: per After Visit Summary and "Baby and Me Booklet".  After visit meds:  Allergies as of 12/03/2016   No Known  Allergies     Medication List    STOP taking these medications   GNP EVENING PRIMROSE OIL 500 MG Caps     TAKE these medications   FLINSTONES GUMMIES OMEGA-3 DHA Chew Chew 2 tablets by mouth daily.   ibuprofen 600 MG tablet Commonly known as:  ADVIL,MOTRIN Take 1 tablet (600 mg total) by mouth every 6 (six) hours.       Diet: routine diet  Activity: Advance as tolerated. Pelvic rest for 6 weeks.   Outpatient follow up: Follow up Appt: Future Appointments Date Time Provider Department Center  12/30/2016 10:00 AM Allie Bossier, MD [facility]   Follow up Visit:No Follow-up on file.  Postpartum contraception: Depo Provera  Newborn Data: Live born female  Birth Weight: 7 lb 1.9 oz (3229 g) APGAR: 8, 9  Baby Feeding: Breast Disposition:home with mother   12/03/2016 Greig Right, CNM

## 2016-12-03 DIAGNOSIS — O480 Post-term pregnancy: Secondary | ICD-10-CM | POA: Diagnosis present

## 2016-12-03 DIAGNOSIS — Z3A41 41 weeks gestation of pregnancy: Secondary | ICD-10-CM

## 2016-12-03 MED ORDER — ACETAMINOPHEN 325 MG PO TABS: 650.0000 mg | ORAL_TABLET | ORAL | Status: DC | PRN

## 2016-12-03 MED ORDER — LIDOCAINE HCL (PF) 1 % IJ SOLN
30.0000 mL | INTRAMUSCULAR | Status: DC | PRN
  Filled 2016-12-03: qty 30

## 2016-12-03 MED ORDER — LACTATED RINGERS IV SOLN: 500.0000 mL | INTRAVENOUS | Status: DC | PRN

## 2016-12-03 MED ORDER — OXYTOCIN 40 UNITS IN LACTATED RINGERS INFUSION - SIMPLE MED: 2.5000 [IU]/h | INTRAVENOUS | Status: DC

## 2016-12-03 MED ORDER — OXYTOCIN BOLUS FROM INFUSION: 500.0000 mL | Freq: Once | INTRAVENOUS | Status: DC

## 2016-12-03 MED ORDER — IBUPROFEN 600 MG PO TABS: 600.0000 mg | ORAL_TABLET | Freq: Four times a day (QID) | ORAL | 0 refills | Status: DC

## 2016-12-03 MED ORDER — LACTATED RINGERS IV SOLN: INTRAVENOUS | Status: DC

## 2016-12-03 MED ORDER — SOD CITRATE-CITRIC ACID 500-334 MG/5ML PO SOLN: 30.0000 mL | ORAL | Status: DC | PRN

## 2016-12-03 MED ORDER — ONDANSETRON HCL 4 MG/2ML IJ SOLN: 4.0000 mg | Freq: Four times a day (QID) | INTRAMUSCULAR | Status: DC | PRN

## 2016-12-03 NOTE — Lactation Note (Signed)
This note was copied from a baby's chart. Lactation Consultation Note:   Mother is independently latching infant in cradle position.  Observed good rhythmic suckling and swallows. Mother taught how to do breast compression as well as recognize when infant swallows. Assist with hand expressing of colostrum after feeding. Observed tiny drops of colostrum. Mother advised to continue to cue base feed and at least 8-12 feedings in 24 hours. Discussed cluster feeding, informed that this is normal for newborns.  Mother advised to do good breast massage and to ice breast to prevent severe engorgement . Mother informed that LC'S are available by phone 7 days a week for questions or concerns. Mother aware of BFSG'S and OP visits. Mother receptive to all teaching.   Patient Name: [place] Today's Date: 12/03/2016 Reason for consult: Follow-up assessment   Maternal Data    Feeding Feeding Type: Breast Fed Length of feed: 15 min  LATCH Score/Interventions Latch: Grasps breast easily, tongue down, lips flanged, rhythmical sucking.  Audible Swallowing: Spontaneous and intermittent  Type of Nipple: Everted at rest and after stimulation  Comfort (Breast/Nipple): Soft / non-tender     Hold (Positioning): No assistance needed to correctly position infant at breast. Intervention(s): Support Pillows;Position options;Skin to skin  LATCH Score: 10  Lactation Tools Discussed/Used     Consult Status Consult Status: Complete    Michel BickersKendrick, Ziya Coonrod McCoy 12/03/2016, 9:57 AM

## 2016-12-08 ENCOUNTER — Other Ambulatory Visit: Payer: Self-pay | Admitting: Adult Health

## 2016-12-30 ENCOUNTER — Encounter: Payer: Self-pay | Admitting: Obstetrics & Gynecology

## 2016-12-30 ENCOUNTER — Ambulatory Visit (INDEPENDENT_AMBULATORY_CARE_PROVIDER_SITE_OTHER): Payer: BLUE CROSS/BLUE SHIELD | Admitting: Obstetrics & Gynecology

## 2016-12-30 DIAGNOSIS — Z3042 Encounter for surveillance of injectable contraceptive: Secondary | ICD-10-CM | POA: Diagnosis not present

## 2016-12-30 DIAGNOSIS — Z309 Encounter for contraceptive management, unspecified: Secondary | ICD-10-CM

## 2016-12-30 DIAGNOSIS — Z124 Encounter for screening for malignant neoplasm of cervix: Secondary | ICD-10-CM

## 2016-12-30 MED ORDER — MEDROXYPROGESTERONE ACETATE 150 MG/ML IM SUSP
150.0000 mg | Freq: Once | INTRAMUSCULAR | Status: AC
  Administered 2016-12-30: 150 mg via INTRAMUSCULAR

## 2016-12-30 NOTE — Progress Notes (Signed)
Post Partum Exam  Brandi Montgomery is a 26 y.o. [id number] female who presents for a postpartum visit. She is 4 weeks postpartum following a spontaneous vaginal delivery. I have fully reviewed the prenatal and intrapartum course. The delivery was at 41 gestational weeks.  Anesthesia: epidural. Postpartum course has been unrmearkable. Baby's course has been unremarkable. Baby is feeding by breast. Bleeding no bleeding. Bowel function is normal. Bladder function is normal. Patient is not sexually active. Contraception method is Depo-Provera injections. Postpartum depression screening:neg  The following portions of the patient's history were reviewed and updated as appropriate: allergies, current medications, past family history, past medical history, past social history, past surgical history and problem list.  Review of Systems Pertinent items are noted in HPI.    Objective:  Blood pressure 108/73, pulse 60, height 5\' 4"  (1.626 m), weight 154 lb (69.9 kg), currently breastfeeding.  General:  alert   Breasts:  inspection negative, no nipple discharge or bleeding, no masses or nodularity palpable  Lungs: clear to auscultation bilaterally  Heart:  regular rate and rhythm, S1, S2 normal, no murmur, click, rub or gallop  Abdomen: soft, non-tender; bowel sounds normal; no masses,  no organomegaly   Vulva:  normal  Vagina: normal vagina  Cervix:  anteverted  Corpus: normal  Adnexa:  no mass, fullness, tenderness  Rectal Exam: Not performed.        Assessment:    Normal postpartum exam. Pap smear done at today's visit. Depo provera today   Plan:   1. Contraception: Depo-Provera injections 3. Follow up in: 3 months or as needed.

## 2017-01-03 LAB — CYTOLOGY - PAP: Diagnosis: NEGATIVE

## 2017-03-17 ENCOUNTER — Telehealth: Payer: Self-pay | Admitting: *Deleted

## 2017-03-17 ENCOUNTER — Ambulatory Visit: Payer: BLUE CROSS/BLUE SHIELD

## 2017-03-17 DIAGNOSIS — Z3042 Encounter for surveillance of injectable contraceptive: Secondary | ICD-10-CM

## 2017-03-17 MED ORDER — MEDROXYPROGESTERONE ACETATE 150 MG/ML IM SUSP: 150.0000 mg | INTRAMUSCULAR | 3 refills | Status: DC

## 2017-03-17 NOTE — Telephone Encounter (Signed)
Pt has appt coming up for Depo Provera injection, needs medication sent into her pharmacy.

## 2017-03-22 ENCOUNTER — Ambulatory Visit: Payer: Self-pay

## 2017-08-22 ENCOUNTER — Ambulatory Visit: Payer: Self-pay | Admitting: Family Medicine

## 2017-08-22 ENCOUNTER — Ambulatory Visit: Payer: Self-pay | Admitting: Sports Medicine

## 2018-11-12 ENCOUNTER — Emergency Department: Payer: Self-pay

## 2018-11-12 ENCOUNTER — Encounter: Payer: Self-pay | Admitting: Emergency Medicine

## 2018-11-12 ENCOUNTER — Other Ambulatory Visit: Payer: Self-pay

## 2018-11-12 ENCOUNTER — Inpatient Hospital Stay
Admission: EM | Admit: 2018-11-12 | Discharge: 2018-11-14 | DRG: 419 | Disposition: A | Discharge status: Home / Self Care | Payer: Self-pay | Attending: General Surgery | Admitting: General Surgery

## 2018-11-12 DIAGNOSIS — Z8711 Personal history of peptic ulcer disease: Secondary | ICD-10-CM

## 2018-11-12 DIAGNOSIS — K8000 Calculus of gallbladder with acute cholecystitis without obstruction: Principal | ICD-10-CM | POA: Diagnosis present

## 2018-11-12 DIAGNOSIS — K8050 Calculus of bile duct without cholangitis or cholecystitis without obstruction: Secondary | ICD-10-CM | POA: Diagnosis present

## 2018-11-12 DIAGNOSIS — Z23 Encounter for immunization: Secondary | ICD-10-CM

## 2018-11-12 DIAGNOSIS — K8020 Calculus of gallbladder without cholecystitis without obstruction: Secondary | ICD-10-CM

## 2018-11-12 LAB — COMPREHENSIVE METABOLIC PANEL
ALBUMIN: 4.3 g/dL (ref 3.5–5.0)
ALT: 17 U/L (ref 0–44)
ANION GAP: 7 (ref 5–15)
AST: 18 U/L (ref 15–41)
Alkaline Phosphatase: 48 U/L (ref 38–126)
BUN: 8 mg/dL (ref 6–20)
CHLORIDE: 107 mmol/L (ref 98–111)
CO2: 23 mmol/L (ref 22–32)
Calcium: 8.8 mg/dL — ABNORMAL LOW (ref 8.9–10.3)
Creatinine, Ser: 0.71 mg/dL (ref 0.44–1.00)
GFR calc Af Amer: >60 mL/min (ref >60)
Glucose, Bld: 111 mg/dL — ABNORMAL HIGH (ref 70–99)
POTASSIUM: 3.4 mmol/L — AB (ref 3.5–5.1)
Sodium: 137 mmol/L (ref 135–145)
Total Bilirubin: 0.4 mg/dL (ref 0.3–1.2)
Total Protein: 7.3 g/dL (ref 6.5–8.1)

## 2018-11-12 LAB — CBC
HCT: 39.1 % (ref 36.0–46.0)
HEMOGLOBIN: 13 g/dL (ref 12.0–15.0)
MCH: 29.3 pg (ref 26.0–34.0)
MCHC: 33.2 g/dL (ref 30.0–36.0)
MCV: 88.1 fL (ref 80.0–100.0)
Platelets: 155 10*3/uL (ref 150–400)
RBC: 4.44 MIL/uL (ref 3.87–5.11)
RDW: 11.7 % (ref 11.5–15.5)
WBC: 5.4 10*3/uL (ref 4.0–10.5)
nRBC: 0 % (ref 0.0–0.2)

## 2018-11-12 LAB — URINALYSIS, COMPLETE (UACMP) WITH MICROSCOPIC
BILIRUBIN URINE: NEGATIVE
GLUCOSE, UA: NEGATIVE mg/dL
Hgb urine dipstick: NEGATIVE
KETONES UR: NEGATIVE mg/dL
Nitrite: NEGATIVE
PROTEIN: NEGATIVE mg/dL
Specific Gravity, Urine: 1.026 (ref 1.005–1.030)
pH: 5 (ref 5.0–8.0)

## 2018-11-12 LAB — TROPONIN I: Troponin I: <0.03 ng/mL (ref <0.03)

## 2018-11-12 LAB — LIPASE, BLOOD: LIPASE: 33 U/L (ref 11–51)

## 2018-11-12 LAB — POCT PREGNANCY, URINE: Preg Test, Ur: NEGATIVE

## 2018-11-12 MED ORDER — MORPHINE SULFATE (PF) 4 MG/ML IV SOLN
4.0000 mg | Freq: Once | INTRAVENOUS | Status: AC
  Administered 2018-11-12: 4 mg via INTRAVENOUS
  Filled 2018-11-12: qty 1

## 2018-11-12 MED ORDER — MORPHINE SULFATE (PF) 2 MG/ML IV SOLN
2.0000 mg | INTRAVENOUS | Status: DC | PRN
  Administered 2018-11-12: 2 mg via INTRAVENOUS
  Filled 2018-11-12: qty 1

## 2018-11-12 MED ORDER — LIDOCAINE VISCOUS HCL 2 % MT SOLN
15.0000 mL | Freq: Once | OROMUCOSAL | Status: AC
  Administered 2018-11-12: 15 mL via OROMUCOSAL
  Filled 2018-11-12: qty 15

## 2018-11-12 MED ORDER — SODIUM CHLORIDE 0.9 % IV SOLN
2.0000 g | INTRAVENOUS | Status: DC
  Administered 2018-11-12: 2 g via INTRAVENOUS
  Filled 2018-11-12: qty 20
  Filled 2018-11-12: qty 2
  Filled 2018-11-12 (×2): qty 20

## 2018-11-12 MED ORDER — PANTOPRAZOLE SODIUM 40 MG IV SOLR
40.0000 mg | Freq: Once | INTRAVENOUS | Status: AC
Start: 2018-11-12 — End: 2018-11-12
  Administered 2018-11-12: 40 mg via INTRAVENOUS
  Filled 2018-11-12: qty 40

## 2018-11-12 MED ORDER — HYDROMORPHONE HCL 1 MG/ML IJ SOLN
0.5000 mg | INTRAMUSCULAR | Status: DC | PRN
  Administered 2018-11-12 – 2018-11-14 (×7): 0.5 mg via INTRAVENOUS
  Filled 2018-11-12 (×7): qty 0.5

## 2018-11-12 MED ORDER — INFLUENZA VAC SPLIT QUAD 0.5 ML IM SUSY
0.5000 mL | PREFILLED_SYRINGE | INTRAMUSCULAR | Status: AC
  Administered 2018-11-14: 0.5 mL via INTRAMUSCULAR
  Filled 2018-11-12: qty 0.5

## 2018-11-12 MED ORDER — SODIUM CHLORIDE 0.9 % IV BOLUS
1000.0000 mL | Freq: Once | INTRAVENOUS | Status: AC
  Administered 2018-11-12: 1000 mL via INTRAVENOUS

## 2018-11-12 MED ORDER — ONDANSETRON 4 MG PO TBDP: 4.0000 mg | ORAL_TABLET | Freq: Four times a day (QID) | ORAL | Status: DC | PRN

## 2018-11-12 MED ORDER — ACETAMINOPHEN 325 MG PO TABS: 650.0000 mg | ORAL_TABLET | Freq: Four times a day (QID) | ORAL | Status: DC | PRN

## 2018-11-12 MED ORDER — PROMETHAZINE HCL 25 MG/ML IJ SOLN
12.5000 mg | Freq: Once | INTRAMUSCULAR | Status: AC
  Administered 2018-11-12: 12.5 mg via INTRAVENOUS
  Filled 2018-11-12: qty 1

## 2018-11-12 MED ORDER — TRAMADOL HCL 50 MG PO TABS
50.0000 mg | ORAL_TABLET | Freq: Four times a day (QID) | ORAL | Status: DC | PRN
  Administered 2018-11-12: 50 mg via ORAL
  Filled 2018-11-12: qty 1

## 2018-11-12 MED ORDER — HYDROCODONE-ACETAMINOPHEN 5-325 MG PO TABS
1.0000 | ORAL_TABLET | ORAL | Status: DC | PRN
  Administered 2018-11-13 – 2018-11-14 (×3): 2 via ORAL
  Filled 2018-11-12 (×3): qty 2

## 2018-11-12 MED ORDER — SODIUM CHLORIDE 0.9% FLUSH: 3.0000 mL | Freq: Once | INTRAVENOUS | Status: DC

## 2018-11-12 MED ORDER — ALUM & MAG HYDROXIDE-SIMETH 200-200-20 MG/5ML PO SUSP
30.0000 mL | Freq: Once | ORAL | Status: AC
  Administered 2018-11-12: 30 mL via ORAL
  Filled 2018-11-12: qty 30

## 2018-11-12 MED ORDER — ONDANSETRON HCL 4 MG/2ML IJ SOLN
4.0000 mg | Freq: Once | INTRAMUSCULAR | Status: AC
  Administered 2018-11-12: 4 mg via INTRAVENOUS
  Filled 2018-11-12: qty 2

## 2018-11-12 MED ORDER — DOCUSATE SODIUM 100 MG PO CAPS: 100.0000 mg | ORAL_CAPSULE | Freq: Two times a day (BID) | ORAL | Status: DC | PRN

## 2018-11-12 MED ORDER — LACTATED RINGERS IV SOLN
INTRAVENOUS | Status: DC
  Administered 2018-11-12 – 2018-11-13 (×4): via INTRAVENOUS

## 2018-11-12 MED ORDER — DIPHENHYDRAMINE HCL 50 MG/ML IJ SOLN
25.0000 mg | Freq: Four times a day (QID) | INTRAMUSCULAR | Status: DC | PRN
  Administered 2018-11-12 – 2018-11-13 (×2): 25 mg via INTRAVENOUS
  Filled 2018-11-12 (×2): qty 1

## 2018-11-12 MED ORDER — ONDANSETRON HCL 4 MG/2ML IJ SOLN
4.0000 mg | Freq: Four times a day (QID) | INTRAMUSCULAR | Status: DC | PRN
  Administered 2018-11-13 – 2018-11-14 (×2): 4 mg via INTRAVENOUS
  Filled 2018-11-12 (×2): qty 2

## 2018-11-12 NOTE — ED Triage Notes (Signed)
Pt reports abd pain and nausea since Friday. Pt reports hx of gastric ulcer.

## 2018-11-12 NOTE — ED Provider Notes (Signed)
[facility] Emergency Department Provider Note  ____________________________________________   First MD Initiated Contact with Patient 11/12/18 [phone]     (approximate)  I have reviewed the triage vital signs and the nursing notes.   HISTORY  Chief Complaint Abdominal Pain   HPI Brandi Montgomery is a 28 y.o. female with a history of "stomach ulcers" was presented to the emergency department today complaining of epigastric burning radiating through to her back.  Patient says the pain is been ongoing since yesterday morning.  Says that she has a history of ulcers that were diagnosed after her pregnancy 10 years ago.  However, says that they were diagnosed based off of clinical suspicion and she has never had an endoscopy.  She says that she has nauseous but without vomiting.  Denies any diarrhea or blood in her stool.  Says that she is tried Pepto-Bismol at home without relief.  Says that she has 2-3 drinks per day and has just returned home from a cruise but did not do any excessive drinking on the cruise beyond her normal.  Denies any drug use.   Past Medical History:  Diagnosis Date  . Chronic kidney disease     Patient Active Problem List   Diagnosis Date Noted  . Biliary colic 11/12/2018  . Post term pregnancy at [redacted] weeks gestation 12/03/2016  . Post-dates pregnancy 12/01/2016  . Supervision of normal pregnancy 07/01/2016    Past Surgical History:  Procedure Laterality Date  . ANKLE SURGERY Left 2005    Prior to Admission medications   Medication Sig Start Date End Date Taking? Authorizing Provider  acetaminophen (TYLENOL) 325 MG tablet Take 650 mg by mouth every 6 (six) hours as needed.   Yes [provider]    Allergies Patient has no known allergies.  Family History  Problem Relation Age of Onset  . Stroke Mother 30       Blood clot related to diet pills.  . Cancer Maternal Grandmother 2155       Breast cancer    Social  History Social History   Tobacco Use  . Smoking status: Never Smoker  . Smokeless tobacco: Never Used  Substance Use Topics  . Alcohol use: No    Alcohol/week: 0.0 standard drinks  . Drug use: No    Review of Systems  Constitutional: No fever/chills Eyes: No visual changes. ENT: No sore throat. Cardiovascular: Denies chest pain. Respiratory: Denies shortness of breath. Gastrointestinal:  no vomiting.  No diarrhea.  No constipation. Genitourinary: Negative for dysuria. Musculoskeletal: As above Skin: Negative for rash. Neurological: Negative for headaches, focal weakness or numbness.   ____________________________________________   PHYSICAL EXAM:  VITAL SIGNS: ED Triage Vitals  Enc Vitals Group     BP 11/12/18 0934 (!) 144/90     Pulse Rate 11/12/18 0934 74     Resp 11/12/18 0934 16     Temp 11/12/18 0934 97.9 F (36.6 C)     Temp Source 11/12/18 0934 Oral     SpO2 11/12/18 0934 100 %     Weight 11/12/18 0928 152 lb 1.9 oz (69 kg)     Height 11/12/18 0952 5\' 3"  (1.6 m)     Head Circumference --      Peak Flow --      Pain Score 11/12/18 0928 9     Pain Loc --      Pain Edu? --      Excl. in GC? --  Constitutional: Alert and oriented.  Appears uncomfortable, holding her upper abdomen. Eyes: Conjunctivae are normal.  Head: Atraumatic. Nose: No congestion/rhinnorhea. Mouth/Throat: Mucous membranes are moist.  Neck: No stridor.   Cardiovascular: Normal rate, regular rhythm. Grossly normal heart sounds.   Respiratory: Normal respiratory effort.  No retractions. Lungs CTAB. Gastrointestinal: Soft with moderate tenderness across the upper abdomen.  No rebound or guarding.  Negative Murphy sign. No distention. No CVA tenderness. Musculoskeletal: No lower extremity tenderness nor edema.  No joint effusions. Neurologic:  Normal speech and language. No gross focal neurologic deficits are appreciated. Skin:  Skin is warm, dry and intact. No rash  noted. Psychiatric: Mood and affect are normal. Speech and behavior are normal.  ____________________________________________   LABS (all labs ordered are listed, but only abnormal results are displayed)  Labs Reviewed  COMPREHENSIVE METABOLIC PANEL - Abnormal; Notable for the following components:      Result Value   Potassium 3.4 (*)    Glucose, Bld 111 (*)    Calcium 8.8 (*)    All other components within normal limits  URINALYSIS, COMPLETE (UACMP) WITH MICROSCOPIC - Abnormal; Notable for the following components:   Color, Urine YELLOW (*)    APPearance HAZY (*)    Leukocytes, UA SMALL (*)    Bacteria, UA RARE (*)    All other components within normal limits  URINE CULTURE  LIPASE, BLOOD  CBC  TROPONIN I  POC URINE PREG, ED  POCT PREGNANCY, URINE   ____________________________________________  EKG  ED ECG REPORT I, Arelia Longest, the attending physician, personally viewed and interpreted this ECG.   Date: 11/12/2018  EKG Time: 0933  Rate: 72  Rhythm: normal sinus rhythm  Axis: Normal  Intervals:none  ST&T Change: No ST segment elevation or depression.  No abnormal T wave inversion.  ____________________________________________  RADIOLOGY  Ultrasound with cholelithiasis and gallbladder sludge.  No evidence for gallbladder inflammation.  Chest x-ray without active disease. ____________________________________________   PROCEDURES  Procedure(s) performed:   Procedures  Critical Care performed:   ____________________________________________   INITIAL IMPRESSION / ASSESSMENT AND PLAN / ED COURSE  Pertinent labs & imaging results that were available during my care of the patient were reviewed by me and considered in my medical decision making (see chart for details).  Differential diagnosis includes, but is not limited to, biliary disease (biliary colic, acute cholecystitis, cholangitis, choledo cholithiasis, etc), intrathoracic causes for  epigastric abdominal pain including ACS, gastritis, duodenitis, pancreatitis, small bowel or large bowel obstruction, abdominal aortic aneurysm, hernia, and ulcer(s). As part of my medical decision making, I reviewed the following data within the electronic MEDICAL RECORD NUMBER Notes from prior ED visits  ----------------------------------------- 12:51 PM on 11/12/2018 -----------------------------------------  Patient given morphine without relief of pain.  Persistent upper abdominal pain.  Evaluated by Dr. Tonna Boehringer of surgery who will be admitting the patient to the hospital. ____________________________________________   FINAL CLINICAL IMPRESSION(S) / ED DIAGNOSES  Symptomatic cholelithiasis.  NEW MEDICATIONS STARTED DURING THIS VISIT:  New Prescriptions   No medications on file     Note:  This document was prepared using Dragon voice recognition software and may include unintentional dictation errors.     Myrna Blazer, MD 11/12/18 1302

## 2018-11-12 NOTE — ED Notes (Signed)
Patient transported to Ultrasound 

## 2018-11-12 NOTE — H&P (Signed)
Subjective:   CC: Biliary colic  HPI:  Brandi Montgomery is a 28 y.o. female who is consulted by [facility] for evaluation of above cc.  Symptoms were first noted 1 day ago. Pain is epigastric extending to right upper quadrant constant, dull pain, worsening, associated with nausea but denies any vomiting, exacerbated by nothing specific     Past Medical History:  has a past medical history of Chronic kidney disease. also gastric ulcer  Past Surgical History:  has a past surgical history that includes Ankle surgery (Left, 2005).  Family History: family history includes Cancer (age of onset: 23) in her maternal grandmother; Stroke (age of onset: 14) in her mother.  Social History:  reports that she has never smoked. She has never used smokeless tobacco. She reports current alcohol use. She reports that she does not use drugs.  Current Medications:  Medications Prior to Admission  Medication Sig Dispense Refill  . acetaminophen (TYLENOL) 325 MG tablet Take 650 mg by mouth every 6 (six) hours as needed.      Allergies:  Allergies as of 11/12/2018  . (No Known Allergies)    ROS:  General: Denies weight loss, weight gain, fatigue, fevers, chills, and night sweats. Eyes: Denies blurry vision, double vision, eye pain, itchy eyes, and tearing. Ears: Denies hearing loss, earache, and ringing in ears. Nose: Denies sinus pain, congestion, infections, runny nose, and nosebleeds. Mouth/throat: Denies hoarseness, sore throat, bleeding gums, and difficulty swallowing. Heart: Denies chest pain, palpitations, racing heart, irregular heartbeat, leg pain or swelling, and decreased activity tolerance. Respiratory: Denies breathing difficulty, shortness of breath, wheezing, cough, and sputum. GI: Denies, vomiting, constipation, diarrhea, and blood in stool. GU: Denies difficulty urinating, pain with urinating, urgency, frequency, blood in urine. Musculoskeletal: Denies joint stiffness, pain, swelling,  muscle weakness. Skin: Denies rash, itching, mass, tumors, sores, and boils Neurologic: Denies headache, fainting, dizziness, seizures, numbness, and tingling. Psychiatric: Denies depression, anxiety, difficulty sleeping, and memory loss. Endocrine: Denies heat or cold intolerance, and increased thirst or urination. Blood/lymph: Denies easy bruising, easy bruising, and swollen glands     Objective:     BP (!) 120/92   Pulse (!) 53   Temp 98.3 F (36.8 C) (Oral)   Resp 16   Ht 5\' 3"  (1.6 m)   Wt 54.4 kg   LMP 10/15/2018 (Approximate)   SpO2 100%   BMI 21.26 kg/m    Constitutional :  alert, cooperative, appears stated age and no distress  Lymphatics/Throat:  no asymmetry, masses, or scars  Respiratory:  clear to auscultation bilaterally  Cardiovascular:  regular rate and rhythm  Gastrointestinal: Soft, no guarding, a focal tenderness in the epigastric to the right upper quadrant area.   Musculoskeletal: Steady gait and movement  Skin: Cool and moist  Psychiatric: Normal affect, non-agitated, not confused       LABS:  CMP Latest Ref Rng & Units 11/12/2018 03/02/2014 12/04/2013  Glucose 70 - 99 mg/dL 287(G) 84 81  BUN 6 - 20 mg/dL 8 8 10   Creatinine 0.44 - 1.00 mg/dL 8.11 5.72 6.20  Sodium 135 - 145 mmol/L 137 134(L) 137  Potassium 3.5 - 5.1 mmol/L 3.4(L) 3.2(L) 4.4  Chloride 98 - 111 mmol/L 107 99 106  CO2 22 - 32 mmol/L 23 26 26   Calcium 8.9 - 10.3 mg/dL 3.5(D) 9.3 9.2  Total Protein 6.5 - 8.1 g/dL 7.3 9.7(C) 7.1  Total Bilirubin 0.3 - 1.2 mg/dL 0.4 0.5 0.4  Alkaline Phos 38 - 126 U/L 48  67 51  AST 15 - 41 U/L 18 12(L) 15  ALT 0 - 44 U/L 17 20 13    CBC Latest Ref Rng & Units 11/12/2018 12/01/2016 09/27/2016  WBC 4.0 - 10.5 K/uL 5.4 12.5(H) -  Hemoglobin 12.0 - 15.0 g/dL 10.213.0 10.9(L) 10.5(L)  Hematocrit 36.0 - 46.0 % 39.1 32.4(L) 32.1(L)  Platelets 150 - 400 K/uL 155 247 -     RADS: CLINICAL DATA:  28 year old with right upper quadrant pain for  3 days.  EXAM: ULTRASOUND ABDOMEN LIMITED RIGHT UPPER QUADRANT  COMPARISON:  CT abdomen 06/17/2008  FINDINGS: Gallbladder:  Large amount of echogenic material within the gallbladder is compatible with sludge. There are at least 2 gallstones. Largest stone measures 2.3 cm at the fundus with posterior acoustic shadowing. There is also a stone at the base of the gallbladder measuring up to 1.3 cm. No gallbladder wall thickening. Reportedly, no sonographic Murphy sign. Gallbladder is mildly distended.  Common bile duct:  Diameter: 0.6 cm  Liver:  No focal lesion identified. Within normal limits in parenchymal echogenicity. Portal vein is patent on color Doppler imaging with normal direction of blood flow towards the liver.  IMPRESSION: Cholelithiasis and gallbladder sludge. No evidence for gallbladder inflammation.  Normal appearance of the liver.  No biliary dilatation.   Electronically Signed   By: Richarda OverlieAdam  Henn M.D.   On: 11/12/2018 11:59  Assessment:      Biliary colic with sludge and stones noted on ultrasound  Plan:      Discussed the risk of surgery including post-op infxn, seroma, biloma, chronic pain, poor-delayed wound healing, retained gallstone, conversion to open procedure, post-op SBO or ileus, and need for additional procedures to address said risks.  The risks of general anesthetic including MI, CVA, sudden death or even reaction to anesthetic medications also discussed. Alternatives include continued observation.  Benefits include possible symptom relief, prevention of complications including acute cholecystitis, pancreatitis.  Typical post operative recovery of 3-5 days rest, continued pain in area and incision sites, possible loose stools up to 4-6 weeks, also discussed.  The patient understands the risks, any and all questions were answered to the patient's satisfaction.  Despite the history of gastric ulcers.  Patient states that this  pain is by far the worst she has ever experienced and trial of a GI cocktail at the ED did not have any improvement in her symptoms.  I explained to her that the possibility of her gallbladder causing her pain is likely high enough to the point that undergoing a cholecystectomy to see if pain can be relieved will be worthwhile.  She is otherwise a healthy individual with minimal risk factors for increasing possible perioperative complications.  Patient and husband both verbalized understood standing and agrees to proceed with surgery.  Due to the unrelenting pain patient requested to kept in-house while awaiting surgery.  Tentatively scheduled for tomorrow afternoon.  N.p.o. after midnight IV fluids we will start her on some antibiotics in preparation as well.  Repeat labs in the mornings

## 2018-11-12 NOTE — Progress Notes (Signed)
   11/12/18 1500  Clinical Encounter Type  Visited With Patient  Visit Type Initial  Referral From Nurse  Spiritual Encounters  Spiritual Needs Brochure    Chaplain received an OR to complete or update an AD. Patient laying in bed; no family at the bedside. She indicates that she is interested in hearing about the AD; education provided. Patient plans to discuss the document with her loved one upon his return. She will ask nurse to call or page if/when ready to complete.

## 2018-11-12 NOTE — ED Notes (Signed)
ED TO INPATIENT HANDOFF REPORT  ED Nurse Name and Phone #: Victorino Dike [phone]  Name/Age/Gender Brandi Montgomery 28 y.o. female Room/Bed: ED09A/ED09A  Code Status   Code Status: Prior  Home/SNF/Other Home Patient oriented to: self, place, time and situation Is this baseline? Yes   Triage Complete: Triage complete  Chief Complaint stomach ulcer new one  Triage Note Pt reports abd pain and nausea since Friday. Pt reports hx of gastric ulcer.    Allergies No Known Allergies  Level of Care/Admitting Diagnosis ED Disposition    ED Disposition Condition Comment   Admit  Hospital Area: [facility] [[id number]]  Level of Care: Med-Surg [16]  Diagnosis: Biliary colic [696295]  Admitting Physician: Sung Amabile [[id number]]  Attending Physician: Sung Amabile [[id number]]  Estimated length of stay: 3 - 4 days  Certification:: I certify this patient will need inpatient services for at least 2 midnights  PT Class (Do Not Modify): Inpatient [101]  PT Acc Code (Do Not Modify): Private [1]       Medical/Surgery History Past Medical History:  Diagnosis Date  . Chronic kidney disease    Past Surgical History:  Procedure Laterality Date  . ANKLE SURGERY Left 2005     IV Location/Drains/Wounds Patient Lines/Drains/Airways Status   Active Line/Drains/Airways    Name:   Placement date:   Placement time:   Site:   Days:   Peripheral IV 11/12/18 Left Antecubital   11/12/18    0951    Antecubital   less than 1          Intake/Output Last 24 hours  Intake/Output Summary (Last 24 hours) at 11/12/2018 1313 Last data filed at 11/12/2018 1215 Gross per 24 hour  Intake 1000 ml  Output -  Net 1000 ml    Labs/Imaging Results for orders placed or performed during the hospital encounter of 11/12/18 (from the past 48 hour(s))  Lipase, blood     Status: None   Collection Time: 11/12/18  9:54 AM  Result Value Ref Range   Lipase 33 11 - 51 U/L    Comment: Performed at  [facility], [address] [place], [place] [address]  Comprehensive metabolic panel     Status: Abnormal   Collection Time: 11/12/18  9:54 AM  Result Value Ref Range   Sodium 137 135 - 145 mmol/L   Potassium 3.4 (L) 3.5 - 5.1 mmol/L   Chloride 107 98 - 111 mmol/L   CO2 23 22 - 32 mmol/L   Glucose, Bld 111 (H) 70 - 99 mg/dL   BUN 8 6 - 20 mg/dL   Creatinine, Ser 4.03 0.44 - 1.00 mg/dL   Calcium 8.8 (L) 8.9 - 10.3 mg/dL   Total Protein 7.3 6.5 - 8.1 g/dL   Albumin 4.3 3.5 - 5.0 g/dL   AST 18 15 - 41 U/L   ALT 17 0 - 44 U/L   Alkaline Phosphatase 48 38 - 126 U/L   Total Bilirubin 0.4 0.3 - 1.2 mg/dL   GFR calc non Af Amer >60 >60 mL/min   GFR calc Af Amer >60 >60 mL/min   Anion gap 7 5 - 15    Comment: Performed at [facility], [address] [place], [place] [address]  CBC     Status: None   Collection Time: 11/12/18  9:54 AM  Result Value Ref Range   WBC 5.4 4.0 - 10.5 K/uL   RBC 4.44 3.87 - 5.11 MIL/uL   Hemoglobin 13.0  12.0 - 15.0 g/dL   HCT 23.5 36.1 - 44.3 %   MCV 88.1 80.0 - 100.0 fL   MCH 29.3 26.0 - 34.0 pg   MCHC 33.2 30.0 - 36.0 g/dL   RDW 15.4 00.8 - 67.6 %   Platelets 155 150 - 400 K/uL   nRBC 0.0 0.0 - 0.2 %    Comment: Performed at [facility], [address] [place], [place] [address]  Urinalysis, Complete w Microscopic     Status: Abnormal   Collection Time: 11/12/18  9:54 AM  Result Value Ref Range   Color, Urine YELLOW (A) YELLOW   APPearance HAZY (A) CLEAR   Specific Gravity, Urine 1.026 1.005 - 1.030   pH 5.0 5.0 - 8.0   Glucose, UA NEGATIVE NEGATIVE mg/dL   Hgb urine dipstick NEGATIVE NEGATIVE   Bilirubin Urine NEGATIVE NEGATIVE   Ketones, ur NEGATIVE NEGATIVE mg/dL   Protein, ur NEGATIVE NEGATIVE mg/dL   Nitrite NEGATIVE NEGATIVE   Leukocytes, UA SMALL (A) NEGATIVE   RBC / HPF 0-5 0 - 5 RBC/hpf   WBC, UA 6-10 0 - 5 WBC/hpf   Bacteria, UA RARE (A) NONE SEEN   Squamous Epithelial / LPF 11-20 0 - 5   Mucus  PRESENT     Comment: Performed at [facility], [address] [place], [place] [address]  Troponin I - ONCE - STAT     Status: None   Collection Time: 11/12/18  9:54 AM  Result Value Ref Range   Troponin I <0.03 <0.03 ng/mL    Comment: Performed at [facility], [address] [place], [place] [address]  Pregnancy, urine POC     Status: None   Collection Time: 11/12/18  9:58 AM  Result Value Ref Range   Preg Test, Ur NEGATIVE NEGATIVE    Comment:        THE SENSITIVITY OF THIS METHODOLOGY IS >24 mIU/mL    Dg Chest 1 View  Result Date: 11/12/2018 CLINICAL DATA:  Right upper quadrant abdominal pain EXAM: CHEST  1 VIEW COMPARISON:  None. FINDINGS: Normal heart size. Normal mediastinal contour. No pneumothorax. No pleural effusion. Lungs appear clear, with no acute consolidative airspace disease and no pulmonary edema. IMPRESSION: No active disease. Electronically Signed   By: Delbert Phenix M.D.   On: 11/12/2018 11:27   US Abdomen Limited Ruq  Result Date: 11/12/2018 CLINICAL DATA:  28 year old with right upper quadrant pain for 3 days. EXAM: ULTRASOUND ABDOMEN LIMITED RIGHT UPPER QUADRANT COMPARISON:  CT abdomen 06/17/2008 FINDINGS: Gallbladder: Large amount of echogenic material within the gallbladder is compatible with sludge. There are at least 2 gallstones. Largest stone measures 2.3 cm at the fundus with posterior acoustic shadowing. There is also a stone at the base of the gallbladder measuring up to 1.3 cm. No gallbladder wall thickening. Reportedly, no sonographic Murphy sign. Gallbladder is mildly distended. Common bile duct: Diameter: 0.6 cm Liver: No focal lesion identified. Within normal limits in parenchymal echogenicity. Portal vein is patent on color Doppler imaging with normal direction of blood flow towards the liver. IMPRESSION: Cholelithiasis and gallbladder sludge. No evidence for gallbladder inflammation. Normal appearance of the liver.  No biliary  dilatation. Electronically Signed   By: Richarda Overlie M.D.   On: 11/12/2018 11:59    Pending Labs Unresulted Labs (From admission, onward)    Start     Ordered   11/12/18 1051  Urine Culture  Add-on,   AD  11/12/18 1051   Signed and Held  HIV antibody (Routine Testing)  Once,   R     Signed and Held   Signed and Held  Basic metabolic panel  Daily,   R     Signed and Held   Signed and Held  Magnesium  Daily,   R     Signed and Held   Signed and Held  Phosphorus  Daily,   R     Signed and Held   Signed and Held  CBC  Daily,   R     Signed and Held   Signed and Held  Hepatic function panel  Daily,   R     Signed and Held          Vitals/Pain Today's Vitals   11/12/18 0928 11/12/18 0934 11/12/18 0952 11/12/18 1253  BP:  (!) 144/90  127/89  Pulse:  74  72  Resp:  16  18  Temp:  97.9 F (36.6 C)    TempSrc:  Oral    SpO2:  100%  97%  Weight: 69 kg  54.4 kg   Height:   5\' 3"  (1.6 m)   PainSc: 9        Isolation Precautions No active isolations  Medications Medications  sodium chloride flush (NS) 0.9 % injection 3 mL (3 mLs Intravenous Not Given 11/12/18 0936)  alum & mag hydroxide-simeth (MAALOX/MYLANTA) 200-200-20 MG/5ML suspension 30 mL (30 mLs Oral Given 11/12/18 1000)  lidocaine (XYLOCAINE) 2 % viscous mouth solution 15 mL (15 mLs Mouth/Throat Given 11/12/18 1000)  sodium chloride 0.9 % bolus 1,000 mL (0 mLs Intravenous Stopped 11/12/18 1215)  pantoprazole (PROTONIX) injection 40 mg (40 mg Intravenous Given 11/12/18 1105)  morphine 4 MG/ML injection 4 mg (4 mg Intravenous Given 11/12/18 1105)  ondansetron (ZOFRAN) injection 4 mg (4 mg Intravenous Given 11/12/18 1105)  morphine 4 MG/ML injection 4 mg (4 mg Intravenous Given 11/12/18 1252)    Mobility walks Low fall risk       Recommendations: See Admitting Provider Note  Report given to:

## 2018-11-13 ENCOUNTER — Inpatient Hospital Stay: Payer: Self-pay | Admitting: Anesthesiology

## 2018-11-13 ENCOUNTER — Encounter
Admission: EM | Disposition: A | Discharge status: Home / Self Care | Payer: Self-pay | Source: Home / Self Care | Attending: Surgery

## 2018-11-13 HISTORY — PX: CHOLECYSTECTOMY: SHX55

## 2018-11-13 LAB — URINE CULTURE

## 2018-11-13 LAB — CBC
HCT: 38.1 % (ref 36.0–46.0)
Hemoglobin: 12.7 g/dL (ref 12.0–15.0)
MCH: 29.5 pg (ref 26.0–34.0)
MCHC: 33.3 g/dL (ref 30.0–36.0)
MCV: 88.6 fL (ref 80.0–100.0)
Platelets: 144 10*3/uL — ABNORMAL LOW (ref 150–400)
RBC: 4.3 MIL/uL (ref 3.87–5.11)
RDW: 11.8 % (ref 11.5–15.5)
WBC: 3.5 10*3/uL — ABNORMAL LOW (ref 4.0–10.5)
nRBC: 0 % (ref 0.0–0.2)

## 2018-11-13 LAB — BASIC METABOLIC PANEL
Anion gap: 6 (ref 5–15)
BUN: <5 mg/dL — ABNORMAL LOW (ref 6–20)
CO2: 26 mmol/L (ref 22–32)
Calcium: 8.6 mg/dL — ABNORMAL LOW (ref 8.9–10.3)
Chloride: 104 mmol/L (ref 98–111)
Creatinine, Ser: 0.72 mg/dL (ref 0.44–1.00)
GFR calc non Af Amer: >60 mL/min (ref >60)
Glucose, Bld: 100 mg/dL — ABNORMAL HIGH (ref 70–99)
Potassium: 3.5 mmol/L (ref 3.5–5.1)
Sodium: 136 mmol/L (ref 135–145)

## 2018-11-13 LAB — PHOSPHORUS: Phosphorus: 3.2 mg/dL (ref 2.5–4.6)

## 2018-11-13 LAB — HEPATIC FUNCTION PANEL
ALT: 145 U/L — ABNORMAL HIGH (ref 0–44)
AST: 133 U/L — ABNORMAL HIGH (ref 15–41)
Albumin: 3.7 g/dL (ref 3.5–5.0)
Alkaline Phosphatase: 60 U/L (ref 38–126)
Bilirubin, Direct: <0.1 mg/dL (ref 0.0–0.2)
Total Bilirubin: 0.6 mg/dL (ref 0.3–1.2)
Total Protein: 6.8 g/dL (ref 6.5–8.1)

## 2018-11-13 LAB — MAGNESIUM: Magnesium: 1.8 mg/dL (ref 1.7–2.4)

## 2018-11-13 LAB — SURGICAL PCR SCREEN
MRSA, PCR: NEGATIVE
Staphylococcus aureus: NEGATIVE

## 2018-11-13 SURGERY — LAPAROSCOPIC CHOLECYSTECTOMY
Anesthesia: General | Site: Abdomen

## 2018-11-13 MED ORDER — ACETAMINOPHEN 10 MG/ML IV SOLN
INTRAVENOUS | Status: AC
  Filled 2018-11-13: qty 100

## 2018-11-13 MED ORDER — ONDANSETRON HCL 4 MG/2ML IJ SOLN: 4.0000 mg | Freq: Once | INTRAMUSCULAR | Status: DC | PRN

## 2018-11-13 MED ORDER — PROPOFOL 10 MG/ML IV BOLUS
INTRAVENOUS | Status: DC | PRN
  Administered 2018-11-13: 150 mg via INTRAVENOUS

## 2018-11-13 MED ORDER — LIDOCAINE-EPINEPHRINE (PF) 1 %-1:200000 IJ SOLN: INTRAMUSCULAR | Status: DC | PRN

## 2018-11-13 MED ORDER — FENTANYL CITRATE (PF) 100 MCG/2ML IJ SOLN
INTRAMUSCULAR | Status: AC
  Administered 2018-11-13: 25 ug via INTRAVENOUS
  Filled 2018-11-13: qty 2

## 2018-11-13 MED ORDER — BUPIVACAINE-EPINEPHRINE (PF) 0.25% -1:200000 IJ SOLN
INTRAMUSCULAR | Status: DC | PRN
  Administered 2018-11-13: 30 mL

## 2018-11-13 MED ORDER — MIDAZOLAM HCL 2 MG/2ML IJ SOLN: INTRAMUSCULAR | Status: DC | PRN

## 2018-11-13 MED ORDER — LIDOCAINE HCL (PF) 2 % IJ SOLN
INTRAMUSCULAR | Status: AC
  Filled 2018-11-13: qty 10

## 2018-11-13 MED ORDER — FENTANYL CITRATE (PF) 100 MCG/2ML IJ SOLN
INTRAMUSCULAR | Status: DC | PRN
  Administered 2018-11-13 (×4): 50 ug via INTRAVENOUS

## 2018-11-13 MED ORDER — FENTANYL CITRATE (PF) 100 MCG/2ML IJ SOLN
INTRAMUSCULAR | Status: AC
  Filled 2018-11-13: qty 2

## 2018-11-13 MED ORDER — LORAZEPAM 2 MG/ML IJ SOLN
0.5000 mg | Freq: Once | INTRAMUSCULAR | Status: AC
  Administered 2018-11-13: 0.5 mg via INTRAVENOUS

## 2018-11-13 MED ORDER — DEXAMETHASONE SODIUM PHOSPHATE 10 MG/ML IJ SOLN
INTRAMUSCULAR | Status: AC
  Filled 2018-11-13: qty 1

## 2018-11-13 MED ORDER — ROCURONIUM BROMIDE 50 MG/5ML IV SOLN
INTRAVENOUS | Status: AC
  Filled 2018-11-13: qty 1

## 2018-11-13 MED ORDER — LORAZEPAM 2 MG/ML IJ SOLN
INTRAMUSCULAR | Status: AC
  Administered 2018-11-13: 0.5 mg via INTRAVENOUS
  Filled 2018-11-13: qty 1

## 2018-11-13 MED ORDER — MIDAZOLAM HCL 2 MG/2ML IJ SOLN
INTRAMUSCULAR | Status: DC | PRN
  Administered 2018-11-13: 2 mg via INTRAVENOUS

## 2018-11-13 MED ORDER — FENTANYL CITRATE (PF) 100 MCG/2ML IJ SOLN
25.0000 ug | INTRAMUSCULAR | Status: DC | PRN
  Administered 2018-11-13 (×4): 25 ug via INTRAVENOUS

## 2018-11-13 MED ORDER — CEFAZOLIN SODIUM-DEXTROSE 2-3 GM-%(50ML) IV SOLR
INTRAVENOUS | Status: DC | PRN
  Administered 2018-11-13: 2 g via INTRAVENOUS

## 2018-11-13 MED ORDER — ONDANSETRON HCL 4 MG/2ML IJ SOLN
INTRAMUSCULAR | Status: DC | PRN
  Administered 2018-11-13: 4 mg via INTRAVENOUS

## 2018-11-13 MED ORDER — KETOROLAC TROMETHAMINE 30 MG/ML IJ SOLN
INTRAMUSCULAR | Status: AC
  Filled 2018-11-13: qty 1

## 2018-11-13 MED ORDER — DEXAMETHASONE SODIUM PHOSPHATE 10 MG/ML IJ SOLN
INTRAMUSCULAR | Status: DC | PRN
  Administered 2018-11-13: 10 mg via INTRAVENOUS

## 2018-11-13 MED ORDER — DIAZEPAM 5 MG PO TABS
5.0000 mg | ORAL_TABLET | Freq: Four times a day (QID) | ORAL | Status: DC | PRN
  Administered 2018-11-13 (×2): 5 mg via ORAL
  Filled 2018-11-13 (×2): qty 1

## 2018-11-13 MED ORDER — SUGAMMADEX SODIUM 200 MG/2ML IV SOLN
INTRAVENOUS | Status: DC | PRN
  Administered 2018-11-13: 110 mg via INTRAVENOUS

## 2018-11-13 MED ORDER — KETOROLAC TROMETHAMINE 30 MG/ML IJ SOLN
INTRAMUSCULAR | Status: DC | PRN
  Administered 2018-11-13: 30 mg via INTRAVENOUS

## 2018-11-13 MED ORDER — ONDANSETRON HCL 4 MG/2ML IJ SOLN
INTRAMUSCULAR | Status: AC
  Filled 2018-11-13: qty 2

## 2018-11-13 MED ORDER — MIDAZOLAM HCL 2 MG/2ML IJ SOLN
INTRAMUSCULAR | Status: AC
  Filled 2018-11-13: qty 2

## 2018-11-13 MED ORDER — SUGAMMADEX SODIUM 200 MG/2ML IV SOLN
INTRAVENOUS | Status: AC
  Filled 2018-11-13: qty 2

## 2018-11-13 MED ORDER — LIDOCAINE HCL (CARDIAC) PF 100 MG/5ML IV SOSY
PREFILLED_SYRINGE | INTRAVENOUS | Status: DC | PRN
  Administered 2018-11-13: 40 mg via INTRAVENOUS
  Administered 2018-11-13: 60 mg via INTRAVENOUS

## 2018-11-13 MED ORDER — ACETAMINOPHEN 10 MG/ML IV SOLN
INTRAVENOUS | Status: DC | PRN
  Administered 2018-11-13: 1000 mg via INTRAVENOUS

## 2018-11-13 MED ORDER — PROPOFOL 10 MG/ML IV BOLUS
INTRAVENOUS | Status: AC
  Filled 2018-11-13: qty 20

## 2018-11-13 MED ORDER — ROCURONIUM BROMIDE 100 MG/10ML IV SOLN
INTRAVENOUS | Status: DC | PRN
  Administered 2018-11-13: 40 mg via INTRAVENOUS
  Administered 2018-11-13: 10 mg via INTRAVENOUS

## 2018-11-13 SURGICAL SUPPLY — 37 items
APPLIER CLIP 5 13 M/L LIGAMAX5 (MISCELLANEOUS) ×3
BLADE SURG SZ11 CARB STEEL (BLADE) ×3 IMPLANT
CANISTER SUCT 1200ML W/VALVE (MISCELLANEOUS) ×3 IMPLANT
CHLORAPREP W/TINT 26ML (MISCELLANEOUS) ×3 IMPLANT
CLIP APPLIE 5 13 M/L LIGAMAX5 (MISCELLANEOUS) ×1 IMPLANT
COVER WAND RF STERILE (DRAPES) ×3 IMPLANT
DERMABOND ADVANCED (GAUZE/BANDAGES/DRESSINGS) ×2
DERMABOND ADVANCED .7 DNX12 (GAUZE/BANDAGES/DRESSINGS) ×1 IMPLANT
ELECT REM PT RETURN 9FT ADLT (ELECTROSURGICAL) ×3
ELECTRODE REM PT RTRN 9FT ADLT (ELECTROSURGICAL) ×1 IMPLANT
GLOVE BIO SURGEON STRL SZ 6.5 (GLOVE) ×2 IMPLANT
GLOVE BIO SURGEONS STRL SZ 6.5 (GLOVE) ×1
GOWN STRL REUS W/ TWL LRG LVL3 (GOWN DISPOSABLE) ×4 IMPLANT
GOWN STRL REUS W/TWL LRG LVL3 (GOWN DISPOSABLE) ×8
GRASPER SUT TROCAR 14GX15 (MISCELLANEOUS) IMPLANT
HEMOSTAT SURGICEL 2X3 (HEMOSTASIS) IMPLANT
IRRIGATION STRYKERFLOW (MISCELLANEOUS) ×1 IMPLANT
IRRIGATOR STRYKERFLOW (MISCELLANEOUS) ×3
IV NS 1000ML (IV SOLUTION) ×2
IV NS 1000ML BAXH (IV SOLUTION) ×1 IMPLANT
KIT TURNOVER KIT A (KITS) ×3 IMPLANT
LABEL OR SOLS (LABEL) ×3 IMPLANT
NDL HYPO 25X1 1.5 SAFETY (NEEDLE) ×1 IMPLANT
NDL INSUFFLATION 14GA 120MM (NEEDLE) ×1 IMPLANT
NEEDLE HYPO 25X1 1.5 SAFETY (NEEDLE) ×3 IMPLANT
NEEDLE INSUFFLATION 14GA 120MM (NEEDLE) ×3 IMPLANT
NS IRRIG 500ML POUR BTL (IV SOLUTION) ×3 IMPLANT
PACK LAP CHOLECYSTECTOMY (MISCELLANEOUS) ×3 IMPLANT
POUCH SPECIMEN RETRIEVAL 10MM (ENDOMECHANICALS) ×3 IMPLANT
SCISSORS METZENBAUM CVD 33 (INSTRUMENTS) ×3 IMPLANT
SLEEVE ENDOPATH XCEL 5M (ENDOMECHANICALS) ×6 IMPLANT
SUT MNCRL AB 4-0 PS2 18 (SUTURE) ×5 IMPLANT
SUT VIC AB 0 CT1 36 (SUTURE) IMPLANT
SUT VICRYL 0 AB UR-6 (SUTURE) ×3 IMPLANT
TROCAR XCEL NON-BLD 11X100MML (ENDOMECHANICALS) ×3 IMPLANT
TROCAR XCEL NON-BLD 5MMX100MML (ENDOMECHANICALS) ×3 IMPLANT
TUBING INSUFFLATION (TUBING) ×3 IMPLANT

## 2018-11-13 NOTE — Progress Notes (Signed)
28 y.o. female with cholelithiasis and abdominal pain was evaluated today and there is no change of symptoms or physical exam since yesterday evaluation by Dr. Tonna Boehringer.   All risks, benefits, and alternatives for laparoscopic cholecystectomy procedure were discussed with the patient, all of herr questions were answered to her expressed satisfaction, patient expresses she wishes to proceed, and informed consent was obtained.

## 2018-11-13 NOTE — Anesthesia Postprocedure Evaluation (Signed)
Anesthesia Post Note  Patient: Brandi Montgomery  Procedure(s) Performed: LAPAROSCOPIC CHOLECYSTECTOMY (N/A Abdomen)  Patient location during evaluation: PACU Anesthesia Type: General Level of consciousness: awake and alert Pain management: pain level controlled Vital Signs Assessment: post-procedure vital signs reviewed and stable Respiratory status: spontaneous breathing, nonlabored ventilation, respiratory function stable and patient connected to nasal cannula oxygen Cardiovascular status: blood pressure returned to baseline and stable Postop Assessment: no apparent nausea or vomiting Anesthetic complications: no     Last Vitals:  Vitals:   11/13/18 1651 11/13/18 2042  BP: 105/60 117/79  Pulse: 84 75  Resp: 17 17  Temp: 36.9 C 37 C  SpO2: 96% 98%    Last Pain:  Vitals:   11/13/18 2042  TempSrc: Oral  PainSc:                  [name]

## 2018-11-13 NOTE — Anesthesia Preprocedure Evaluation (Addendum)
Anesthesia Evaluation  Patient identified by MRN, date of birth, ID band Patient awake    Reviewed: Allergy & Precautions, H&P , NPO status , Patient's Chart, lab work & pertinent test results, reviewed documented beta blocker date and time   Airway Mallampati: I  TM Distance: >3 FB Neck ROM: full    Dental no notable dental hx. (+) Teeth Intact   Pulmonary neg pulmonary ROS,    Pulmonary exam normal breath sounds clear to auscultation       Cardiovascular negative cardio ROS Normal cardiovascular exam     Neuro/Psych negative neurological ROS  negative psych ROS   GI/Hepatic negative GI ROS, Neg liver ROS, neg GERD  ,  Endo/Other  negative endocrine ROS  Renal/GU Renal disease     Musculoskeletal   Abdominal Normal abdominal exam  (+)   Peds  Hematology negative hematology ROS (+)   Anesthesia Other Findings Past Medical History: No date: Chronic kidney disease  Past Surgical History: 2005: ANKLE SURGERY; Left  BMI    Body Mass Index:  21.26 kg/m      Reproductive/Obstetrics negative OB ROS                            Anesthesia Physical  Anesthesia Plan  ASA: II  Anesthesia Plan: General   Post-op Pain Management:    Induction: Intravenous  PONV Risk Score and Plan: 4 or greater and Treatment may vary due to age or medical condition, Ondansetron, Midazolam and Dexamethasone  Airway Management Planned: Oral ETT  Additional Equipment:   Intra-op Plan:   Post-operative Plan: Extubation in OR  Informed Consent: I have reviewed the patients History and Physical, chart, labs and discussed the procedure including the risks, benefits and alternatives for the proposed anesthesia with the patient or authorized representative who has indicated his/her understanding and acceptance.     Dental Advisory Given  Plan Discussed with: Surgeon and CRNA  Anesthesia Plan Comments:         Anesthesia Quick Evaluation

## 2018-11-13 NOTE — Transfer of Care (Signed)
Immediate Anesthesia Transfer of Care Note  Patient: [place] G Pinnix  Procedure(s) Performed: LAPAROSCOPIC CHOLECYSTECTOMY (N/A Abdomen)  Patient Location: PACU  Anesthesia Type:General  Level of Consciousness: drowsy and patient cooperative  Airway & Oxygen Therapy: Patient Spontanous Breathing and Patient connected to face mask oxygen  Post-op Assessment: Report given to RN and Post -op Vital signs reviewed and stable  Post vital signs: Reviewed and stable  Last Vitals:  Vitals Value Taken Time  BP 114/72 11/13/2018  4:01 PM  Temp 36.8 C 11/13/2018  4:01 PM  Pulse 85 11/13/2018  4:05 PM  Resp 15 11/13/2018  4:05 PM  SpO2 98 % 11/13/2018  4:05 PM  Vitals shown include unvalidated device data.  Last Pain:  Vitals:   11/13/18 1601  TempSrc:   PainSc: Asleep      Patients Stated Pain Goal: 0 (11/13/18 0729)  Complications: No apparent anesthesia complications

## 2018-11-13 NOTE — Op Note (Signed)
Preoperative diagnosis: Biliary Colic  Postoperative diagnosis: Acute cholecystitis.  Procedure: Laparoscopic Cholecystectomy.   Anesthesia: GETA   Surgeon: Dr. Hazle Quantintron Diaz  Wound Classification: Clean Contaminated  Indications: Patient is a 28 y.o. female developed right upper quadrant pain, nausea, vomiting and on workup was found to have cholelithiasis with a normal common duct. Laparoscopic cholecystectomy was elected.  Findings: Severe pericholecystic edema. No purulent secretions.  Critical view of safety achieved Cystic duct and artery identified, ligated and divided Adequate hemostasis  Description of procedure: The patient was placed on the operating table in the supine position. General anesthesia was induced. A time-out was completed verifying correct patient, procedure, site, positioning, and implant(s) and/or special equipment prior to beginning this procedure. An orogastric tube was placed. The abdomen was prepped and draped in the usual sterile fashion.  An incision was made in a natural skin line above the umbilicus.  The fascia was elevated and the Veress needle inserted. Proper position was confirmed by aspiration and saline meniscus test.  The abdomen was insufflated with carbon dioxide to a pressure of 15 mmHg. The patient tolerated insufflation well. A 11-mm trocar was then inserted.  The laparoscope was inserted and the abdomen inspected. No injuries from initial trocar placement were noted. Additional trocars were then inserted in the following locations: a 5-mm trocar in the right epigastrium and two 5-mm trocars along the right costal margin. The abdomen was inspected and no abnormalities were found. The table was placed in the reverse Trendelenburg position with the right side up.  Filmy adhesions between the gallbladder and omentum, duodenum and transverse colon were lysed sharply. The dome of the gallbladder was grasped with an atraumatic grasper passed through  the lateral port and retracted over the dome of the liver. The infundibulum was also grasped with an atraumatic grasper through the midclavicular port and retracted toward the right lower quadrant. This maneuver exposed Calot's triangle. The peritoneum overlying the gallbladder infundibulum was then incised and the cystic duct and cystic artery identified and circumferentially dissected. Critical view of safety reviewed before ligating any structure. The cystic duct and cystic artery were then doubly clipped and divided close to the gallbladder.  The gallbladder was then dissected from its peritoneal attachments by electrocautery. Hemostasis was checked and the gallbladder and contained stones were removed using an endoscopic retrieval bag placed through the umbilical port. The gallbladder was passed off the table as a specimen. The gallbladder fossa was copiously irrigated with saline and hemostasis was obtained. There was no evidence of bleeding from the gallbladder fossa or cystic artery or leakage of the bile from the cystic duct stump. Secondary trocars were removed under direct vision. No bleeding was noted. The laparoscope was withdrawn and the umbilical trocar removed. The abdomen was allowed to collapse. The fascia of the 11mm trocar sites was closed with figure-of-eight 0 vicryl sutures. The skin was closed with subcuticular sutures of 4-0 monocryl and topical skin adhesive. The orogastric tube was removed.  The patient tolerated the procedure well and was taken to the postanesthesia care unit in stable condition.   Specimen: Gallbladder  Complications: None  EBL: 10 mL

## 2018-11-13 NOTE — Anesthesia Procedure Notes (Signed)
Procedure Name: Intubation Date/Time: 11/13/2018 1:44 PM Performed by: Jonna Clark, CRNA Pre-anesthesia Checklist: Patient identified, Patient being monitored, Timeout performed, Emergency Drugs available and Suction available Patient Re-evaluated:Patient Re-evaluated prior to induction Oxygen Delivery Method: Circle system utilized Preoxygenation: Pre-oxygenation with 100% oxygen Induction Type: IV induction Ventilation: Mask ventilation without difficulty Laryngoscope Size: Mac and 3 Grade View: Grade I Tube type: Oral Tube size: 7.0 mm Number of attempts: 1 Airway Equipment and Method: Stylet Placement Confirmation: ETT inserted through vocal cords under direct vision,  positive ETCO2 and breath sounds checked- equal and bilateral Secured at: 21 cm Tube secured with: Tape Dental Injury: Teeth and Oropharynx as per pre-operative assessment

## 2018-11-13 NOTE — Anesthesia Post-op Follow-up Note (Signed)
Anesthesia QCDR form completed.        

## 2018-11-14 ENCOUNTER — Encounter: Payer: Self-pay | Admitting: Surgery

## 2018-11-14 LAB — HIV ANTIBODY (ROUTINE TESTING W REFLEX): HIV Screen 4th Generation wRfx: NONREACTIVE

## 2018-11-14 MED ORDER — ONDANSETRON 4 MG PO TBDP: 4.0000 mg | ORAL_TABLET | Freq: Four times a day (QID) | ORAL | 0 refills | Status: DC | PRN

## 2018-11-14 MED ORDER — HYDROCODONE-ACETAMINOPHEN 5-325 MG PO TABS: 1.0000 | ORAL_TABLET | ORAL | 0 refills | Status: DC | PRN

## 2018-11-14 NOTE — Discharge Summary (Signed)
  Patient ID: [place] G Pinnix MRN: [id number] DOB/AGE: 1991-07-12 28 y.o.  Admit date: 11/12/2018 Discharge date: 11/14/2018   Discharge Diagnoses:  Active Problems:   Biliary colic  Acute cholecystitis  Procedures:Laparoscopic cholecystectomy  Hospital Course: Patient admitted due to biliary colic without able to control pain. She underwent laparoscopic cholecystectomy and tolerated procedure well. Pain controlled. Nausea after breakfast controlled with anti nausea medications.   Physical Exam  Constitutional: She is well-developed, well-nourished, and in no distress.  HENT:  Head: Normocephalic.  Neck: Normal range of motion.  Cardiovascular: Normal rate and regular rhythm.  Pulmonary/Chest: Effort normal. No respiratory distress.  Abdominal: Soft. She exhibits no distension.  Wounds are dry and clean.    Consults: None  Disposition: Discharge disposition: 01-Home or Self Care       Discharge Instructions    Diet - low sodium heart healthy   Complete by:  As directed      Allergies as of 11/14/2018   No Known Allergies     Medication List    TAKE these medications   acetaminophen 325 MG tablet Commonly known as:  TYLENOL Take 650 mg by mouth every 6 (six) hours as needed.   HYDROcodone-acetaminophen 5-325 MG tablet Commonly known as:  NORCO/VICODIN Take 1-2 tablets by mouth every 4 (four) hours as needed for moderate pain.   ondansetron 4 MG disintegrating tablet Commonly known as:  ZOFRAN-ODT Take 1 tablet (4 mg total) by mouth every 6 (six) hours as needed for nausea.      Follow-up Information    Carolan Shiver, MD Follow up in 2 week(s).   Specialty:  General Surgery Contact information: [address] [place] [address] [phone]

## 2018-11-14 NOTE — Progress Notes (Signed)
11/14/2018 11:42 AM  [place] G Pinnix to be D/C'd Home per MD order.  Discussed prescriptions and follow up appointments with the patient. Prescriptions given to patient, medication list explained in detail. Pt verbalized understanding.  Allergies as of 11/14/2018   No Known Allergies     Medication List    TAKE these medications   acetaminophen 325 MG tablet Commonly known as:  TYLENOL Take 650 mg by mouth every 6 (six) hours as needed.   HYDROcodone-acetaminophen 5-325 MG tablet Commonly known as:  NORCO/VICODIN Take 1-2 tablets by mouth every 4 (four) hours as needed for moderate pain.   ondansetron 4 MG disintegrating tablet Commonly known as:  ZOFRAN-ODT Take 1 tablet (4 mg total) by mouth every 6 (six) hours as needed for nausea.       Vitals:   11/13/18 2042 11/14/18 0537  BP: 117/79 110/82  Pulse: 75 (!) 49  Resp: 17 17  Temp: 98.6 F (37 C) 98.3 F (36.8 C)  SpO2: 98% 100%    Skin clean, dry and intact without evidence of skin break down, no evidence of skin tears noted. IV catheter discontinued intact. Site without signs and symptoms of complications. Dressing and pressure applied. Pt denies pain at this time. No complaints noted.  An After Visit Summary was printed and given to the patient. Patient escorted via WC, and D/C home via private auto.  Bradly Chris

## 2018-11-14 NOTE — Discharge Instructions (Signed)

## 2018-11-15 LAB — SURGICAL PATHOLOGY

## 2021-01-08 ENCOUNTER — Encounter: Payer: Self-pay | Admitting: Family Medicine

## 2021-01-08 ENCOUNTER — Other Ambulatory Visit: Payer: Self-pay

## 2021-01-08 ENCOUNTER — Ambulatory Visit (INDEPENDENT_AMBULATORY_CARE_PROVIDER_SITE_OTHER): Payer: Self-pay | Admitting: Family Medicine

## 2021-01-08 VITALS — BP 120/74 | HR 88 | Ht 64.0 in | Wt 106.0 lb

## 2021-01-08 DIAGNOSIS — Z124 Encounter for screening for malignant neoplasm of cervix: Secondary | ICD-10-CM

## 2021-01-08 DIAGNOSIS — F4323 Adjustment disorder with mixed anxiety and depressed mood: Secondary | ICD-10-CM

## 2021-01-08 DIAGNOSIS — L409 Psoriasis, unspecified: Secondary | ICD-10-CM

## 2021-01-08 DIAGNOSIS — Z7689 Persons encountering health services in other specified circumstances: Secondary | ICD-10-CM

## 2021-01-08 MED ORDER — SERTRALINE HCL 25 MG PO TABS: 25.0000 mg | ORAL_TABLET | Freq: Every day | ORAL | 2 refills | Status: DC

## 2021-01-08 MED ORDER — TRIAMCINOLONE ACETONIDE 0.1 % EX CREA: 1.0000 "application " | TOPICAL_CREAM | Freq: Two times a day (BID) | CUTANEOUS | 2 refills | Status: DC

## 2021-01-08 NOTE — Progress Notes (Signed)
Date:  01/08/2021   Name:  Brandi Montgomery   DOB:  [birth year]   MRN:  [id number]   Chief Complaint: No chief complaint on file.  Patient is a 30 year old female who presents for a establish care with new physician exam. The patient reports the following problems: PHQ 19/GAD 16. Health maintenance has been reviewed pap. Depression        This is a recurrent problem.  The current episode started more than 1 year ago.   The onset quality is gradual.   The problem occurs daily.  The problem has been waxing and waning since onset.  Associated symptoms include decreased concentration, fatigue, helplessness, hopelessness, insomnia, irritable, restlessness, decreased interest, appetite change and sad.  Associated symptoms include no myalgias, no headaches and no suicidal ideas.     The symptoms are aggravated by work stress.  Past treatments include nothing.  Past medical history includes anxiety.   Anxiety Presents for follow-up visit. Symptoms include compulsions, confusion, decreased concentration, depressed mood, excessive worry, insomnia, irritability, nervous/anxious behavior and restlessness. Patient reports no dizziness, malaise, muscle tension, nausea, shortness of breath or suicidal ideas.      Lab Results  Component Value Date   CREATININE 0.72 11/13/2018   BUN <5 (L) 11/13/2018   NA 136 11/13/2018   K 3.5 11/13/2018   CL 104 11/13/2018   CO2 26 11/13/2018   Lab Results  Component Value Date   CHOL 150 12/04/2013   HDL 52 12/04/2013   LDLCALC 87 12/04/2013   TRIG 57 12/04/2013   CHOLHDL 2.9 12/04/2013   Lab Results  Component Value Date   TSH 1.18 03/02/2014   No results found for: HGBA1C Lab Results  Component Value Date   WBC 3.5 (L) 11/13/2018   HGB 12.7 11/13/2018   HCT 38.1 11/13/2018   MCV 88.6 11/13/2018   PLT 144 (L) 11/13/2018   Lab Results  Component Value Date   ALT 145 (H) 11/13/2018   AST 133 (H) 11/13/2018   ALKPHOS 60 11/13/2018   BILITOT 0.6  11/13/2018     Review of Systems  Constitutional: Positive for appetite change, fatigue and irritability. Negative for chills, fever and unexpected weight change.  HENT: Negative for congestion, ear discharge, ear pain, rhinorrhea, sinus pressure, sneezing and sore throat.   Eyes: Negative for photophobia, pain, discharge, redness and itching.  Respiratory: Negative for cough, shortness of breath, wheezing and stridor.   Gastrointestinal: Negative for abdominal pain, blood in stool, constipation, diarrhea, nausea and vomiting.  Endocrine: Negative for cold intolerance, heat intolerance, polydipsia, polyphagia and polyuria.  Genitourinary: Negative for dysuria, flank pain, frequency, hematuria, menstrual problem, pelvic pain, urgency, vaginal bleeding and vaginal discharge.  Musculoskeletal: Negative for arthralgias, back pain and myalgias.  Skin: Negative for rash.  Allergic/Immunologic: Negative for environmental allergies and food allergies.  Neurological: Negative for dizziness, weakness, light-headedness, numbness and headaches.  Hematological: Negative for adenopathy. Does not bruise/bleed easily.  Psychiatric/Behavioral: Positive for confusion, decreased concentration and depression. Negative for dysphoric mood and suicidal ideas. The patient is nervous/anxious and has insomnia.     Patient Active Problem List   Diagnosis Date Noted  . Biliary colic 11/12/2018  . Post term pregnancy at [redacted] weeks gestation 12/03/2016  . Post-dates pregnancy 12/01/2016  . Supervision of normal pregnancy 07/01/2016    No Known Allergies  Past Surgical History:  Procedure Laterality Date  . ANKLE SURGERY Left 2005  . CHOLECYSTECTOMY N/A 11/13/2018   Procedure: LAPAROSCOPIC  CHOLECYSTECTOMY;  Surgeon: Sung Amabile, DO;  Location: [facility] ORS;  Service: General;  Laterality: N/A;    Social History   Tobacco Use  . Smoking status: Never Smoker  . Smokeless tobacco: Never Used  Vaping Use  . Vaping  Use: Never used  Substance Use Topics  . Alcohol use: Yes    Alcohol/week: 0.0 standard drinks    Comment: wine, beer, liquor occassionally  . Drug use: No     Medication list has been reviewed and updated.  No outpatient medications have been marked as taking for the 01/08/21 encounter (Office Visit) with Duanne Limerick, MD.    No flowsheet data found.  No flowsheet data found.  BP Readings from Last 3 Encounters:  11/14/18 110/82  12/30/16 108/73  12/03/16 117/70    Physical Exam Vitals and nursing note reviewed.  Constitutional:      General: She is irritable.     Appearance: She is well-developed.  HENT:     Head: Normocephalic.     Right Ear: Tympanic membrane, ear canal and external ear normal.     Left Ear: Tympanic membrane, ear canal and external ear normal.     Nose: Nose normal.     Mouth/Throat:     Mouth: Mucous membranes are moist.  Eyes:     General: Lids are everted, no foreign bodies appreciated. No scleral icterus.       Left eye: No foreign body or hordeolum.     Conjunctiva/sclera: Conjunctivae normal.     Right eye: Right conjunctiva is not injected.     Left eye: Left conjunctiva is not injected.     Pupils: Pupils are equal, round, and reactive to light.  Neck:     Thyroid: No thyromegaly.     Vascular: No JVD.     Trachea: No tracheal deviation.  Cardiovascular:     Rate and Rhythm: Normal rate and regular rhythm.     Heart sounds: Normal heart sounds. No murmur heard. No friction rub. No gallop.   Pulmonary:     Effort: Pulmonary effort is normal. No respiratory distress.     Breath sounds: Normal breath sounds. No stridor. No wheezing, rhonchi or rales.  Abdominal:     General: Bowel sounds are normal.     Palpations: Abdomen is soft. There is no mass.     Tenderness: There is no abdominal tenderness. There is no guarding or rebound.  Musculoskeletal:        General: No tenderness. Normal range of motion.     Cervical back: Normal  range of motion and neck supple.  Lymphadenopathy:     Cervical: No cervical adenopathy.  Skin:    General: Skin is warm.     Findings: No rash.  Neurological:     Mental Status: She is alert and oriented to person, place, and time.     Cranial Nerves: No cranial nerve deficit.     Deep Tendon Reflexes: Reflexes normal.  Psychiatric:        Mood and Affect: Mood is not anxious or depressed.     Wt Readings from Last 3 Encounters:  01/08/21 106 lb (48.1 kg)  11/12/18 120 lb (54.4 kg)  12/30/16 154 lb (69.9 kg)    Ht 5\' 4"  (1.626 m)   Wt 106 lb (48.1 kg)   BMI 18.19 kg/m   Assessment and Plan: 1. Adjustment reaction with anxiety and depression New onset.  Recurrence.  Relatively stable.  Patient has had  a history of depression in the past.  PHQ was noted today to be 19 with a gad score of 16.  We will reinitiate sertraline 25 mg 1/2 tablet (12.5 mg) once a day for 2 weeks then increase to 1 tablet a day.  We will recheck patient in 6 weeks for reevaluation and possible increase of medication.  Patient has been instructed as to watch for any decrease in inhibition or worsening of depression and to contact physician immediately or seek medical help at nearest medical facility. - sertraline (ZOLOFT) 25 MG tablet; Take 1 tablet (25 mg total) by mouth daily. Initiate at one half tablet q day for 2 weeks then 1 tablet a day.  Dispense: 30 tablet; Refill: 2  2. Establishing care with new doctor, encounter for Patient established care with new physician.  Patient's chart was reviewed for previous encounters, most recent labs, most recent imaging, and care everywhere.  3. Cervical cancer screening Patient without history of cervical cancer or abnormal Pap.  Will refer to gynecology for further evaluation and likely Pap smears on a scheduled basis. - Ambulatory referral to Gynecology  4. Psoriasis Patient also notes has an area of psoriasis in the posterior neck area near the hairline.   This is scaling and noted to be inflamed.  We will initiate triamcinolone cream 0.1% apply twice a day. - triamcinolone (KENALOG) 0.1 %; Apply 1 application topically 2 (two) times daily.  Dispense: 30 g; Refill: 2

## 2021-01-21 ENCOUNTER — Encounter: Payer: Self-pay | Admitting: Obstetrics and Gynecology

## 2021-01-21 ENCOUNTER — Other Ambulatory Visit: Payer: Self-pay

## 2021-01-26 ENCOUNTER — Encounter: Payer: Self-pay | Admitting: Family Medicine

## 2021-01-29 ENCOUNTER — Ambulatory Visit: Payer: Self-pay | Admitting: Obstetrics and Gynecology

## 2021-01-29 ENCOUNTER — Encounter: Payer: Self-pay | Admitting: Obstetrics and Gynecology

## 2021-01-29 ENCOUNTER — Other Ambulatory Visit: Payer: Self-pay

## 2021-01-29 ENCOUNTER — Other Ambulatory Visit (HOSPITAL_COMMUNITY)
Admission: RE | Admit: 2021-01-29 | Discharge: 2021-01-29 | Disposition: A | Discharge status: Home / Self Care | Payer: BC Managed Care – PPO | Source: Ambulatory Visit | Attending: Obstetrics and Gynecology | Admitting: Obstetrics and Gynecology

## 2021-01-29 VITALS — BP 113/73 | Ht 64.0 in | Wt 110.0 lb

## 2021-01-29 DIAGNOSIS — Z124 Encounter for screening for malignant neoplasm of cervix: Secondary | ICD-10-CM | POA: Diagnosis present

## 2021-01-29 DIAGNOSIS — Z113 Encounter for screening for infections with a predominantly sexual mode of transmission: Secondary | ICD-10-CM

## 2021-01-29 DIAGNOSIS — Z01419 Encounter for gynecological examination (general) (routine) without abnormal findings: Secondary | ICD-10-CM | POA: Diagnosis present

## 2021-01-29 NOTE — Progress Notes (Signed)
Gynecology Annual Exam  PCP: Duanne Limerick, MD  Chief Complaint  Patient presents with  . Annual Exam   History of Present Illness:  Ms. [place] G Pinnix is a 30 y.o. [id number] who LMP was Patient's last menstrual period was 01/25/2021., presents today for her annual examination.  Her menses are regular every 28-30 days, lasting 6 day(s).  Dysmenorrhea mild, occurring first 1-2 days of flow. She does not have intermenstrual bleeding.  She is sexually active. Her husband has had a vasectomy.  Last Pap: 12/2016  Results were: no abnormalities /neg HPV DNA not done Hx of STDs: none  There is a FH of breast cancer in her MGM. There is no FH of ovarian cancer. The patient does do self-breast exams.  Tobacco use: The patient denies current or previous tobacco use. Alcohol use: social drinker Exercise: "chasing the kids"  The patient wears seatbelts: yes.   The patient reports that domestic violence in her life is absent.   Past Medical History:  Diagnosis Date  . Chronic kidney disease    kidney stones    Past Surgical History:  Procedure Laterality Date  . ANKLE SURGERY Left 2005  . CHOLECYSTECTOMY N/A 11/13/2018   Procedure: LAPAROSCOPIC CHOLECYSTECTOMY;  Surgeon: Sung Amabile, DO;  Location: [facility] ORS;  Service: General;  Laterality: N/A;    Prior to Admission medications   Medication Sig Start Date End Date Taking? Authorizing Provider  sertraline (ZOLOFT) 25 MG tablet Take 1 tablet (25 mg total) by mouth daily. Initiate at one half tablet q day for 2 weeks then 1 tablet a day. 01/08/21  Yes Duanne Limerick, MD   Allergies: No Known Allergies  Obstetric History: [id number], s/p SVD x 2  Social History   Socioeconomic History  . Marital status: Married    Spouse name: Mellody Dance  . Number of children: 2  . Years of education: Not on file  . Highest education level: Not on file  Occupational History  . Not on file  Tobacco Use  . Smoking status: Never Smoker  . Smokeless  tobacco: Never Used  Vaping Use  . Vaping Use: Never used  Substance and Sexual Activity  . Alcohol use: Yes    Alcohol/week: 0.0 standard drinks    Comment: wine, beer, liquor occassionally  . Drug use: No  . Sexual activity: Yes    Partners: Male    Birth control/protection: Surgical    Comment: vasectomy  Other Topics Concern  . Not on file  Social History Narrative   Divorced   High school   Works as a Economist         Social Determinants of Corporate investment banker Strain: Not on file  Food Insecurity: Not on file  Transportation Needs: Not on file  Physical Activity: Not on file  Stress: Not on file  Social Connections: Not on file  Intimate Partner Violence: Not on file    Family History  Problem Relation Age of Onset  . Stroke Mother 30       Blood clot related to diet pills.  . Hypertension Mother   . Cancer Maternal Grandmother 16       Breast cancer  . Diabetes Maternal Grandmother   . Hypertension Maternal Grandmother     Review of Systems  Constitutional: Negative.   HENT: Negative.   Eyes: Negative.   Respiratory: Negative.   Cardiovascular: Negative.   Gastrointestinal: Negative.   Genitourinary: Negative.   Musculoskeletal:  Negative.   Skin: Negative.   Neurological: Negative.   Psychiatric/Behavioral: Negative.      Physical Exam BP 113/73   Ht 5\' 4"  (1.626 m)   Wt 110 lb (49.9 kg)   LMP 01/25/2021   BMI 18.88 kg/m    Physical Exam Constitutional:      General: She is not in acute distress.    Appearance: Normal appearance. She is well-developed.  Genitourinary:     Vulva and bladder normal.     Right Labia: No rash, tenderness, lesions, skin changes or Bartholin's cyst.    Left Labia: No tenderness, lesions, skin changes, Bartholin's cyst or rash.    No inguinal adenopathy present in the right or left side.    Pelvic Tanner Score: 5/5.    No vaginal discharge, erythema, tenderness or bleeding.      Right  Adnexa: not tender, not full and no mass present.    Left Adnexa: not tender, not full and no mass present.    No cervical motion tenderness, discharge, lesion or polyp.     Uterus is not enlarged or tender.     No uterine mass detected.    Pelvic exam was performed with patient in the lithotomy position.  Breasts:     Right: No inverted nipple, mass, nipple discharge, skin change or tenderness.     Left: No inverted nipple, mass, nipple discharge, skin change or tenderness.    HENT:     Head: Normocephalic and atraumatic.  Eyes:     General: No scleral icterus.    Conjunctiva/sclera: Conjunctivae normal.  Neck:     Thyroid: No thyromegaly.  Cardiovascular:     Rate and Rhythm: Normal rate and regular rhythm.     Heart sounds: No murmur heard. No friction rub. No gallop.   Pulmonary:     Effort: Pulmonary effort is normal. No respiratory distress.     Breath sounds: Normal breath sounds. No wheezing or rales.  Abdominal:     General: Bowel sounds are normal. There is no distension.     Palpations: Abdomen is soft. There is no mass.     Tenderness: There is no abdominal tenderness. There is no guarding or rebound.     Hernia: There is no hernia in the left inguinal area or right inguinal area.  Musculoskeletal:        General: No swelling or tenderness. Normal range of motion.     Cervical back: Normal range of motion and neck supple.  Lymphadenopathy:     Cervical: No cervical adenopathy.     Lower Body: No right inguinal adenopathy. No left inguinal adenopathy.  Neurological:     General: No focal deficit present.     Mental Status: She is alert and oriented to person, place, and time.     Cranial Nerves: No cranial nerve deficit.  Skin:    General: Skin is warm and dry.     Findings: No erythema or rash.  Psychiatric:        Mood and Affect: Mood normal.        Behavior: Behavior normal.        Judgment: Judgment normal.     Female chaperone present for pelvic and  breast  portions of the physical exam  Assessment: 30 y.o. G1P2002 female here for routine annual gynecologic examination  Plan: Problem List Items Addressed This Visit   None   Visit Diagnoses    Women's annual routine gynecological examination    -  Primary   Relevant Orders   Cytology - PAP   Pap smear for cervical cancer screening       Relevant Orders   Cytology - PAP   Screen for STD (sexually transmitted disease)       Relevant Orders   Cytology - PAP      Screening: -- Blood pressure screen normal -- Weight screening: normal -- Depression screening negative (PHQ-9) -- Nutrition: normal -- cholesterol screening: not due for screening -- osteoporosis screening: not due -- tobacco screening: not using -- alcohol screening: AUDIT questionnaire indicates low-risk usage. -- family history of breast cancer screening: done. not at high risk. -- no evidence of domestic violence or intimate partner violence. -- STD screening: gonorrhea/chlamydia NAAT collected -- pap smear collected per ASCCP guidelines -- flu vaccine did not  -- HPV vaccination series: Discussed and she will consider  Thomasene Mohair, MD 01/29/2021 8:47 AM

## 2021-02-04 LAB — CYTOLOGY - PAP
Chlamydia: NEGATIVE
Comment: NEGATIVE
Comment: NEGATIVE
Comment: NEGATIVE
Comment: NORMAL
Diagnosis: NEGATIVE
High risk HPV: NEGATIVE
Neisseria Gonorrhea: NEGATIVE
Trichomonas: NEGATIVE

## 2021-02-05 ENCOUNTER — Other Ambulatory Visit: Payer: Self-pay

## 2021-02-05 ENCOUNTER — Encounter: Payer: Self-pay | Admitting: Family Medicine

## 2021-02-05 ENCOUNTER — Ambulatory Visit (INDEPENDENT_AMBULATORY_CARE_PROVIDER_SITE_OTHER): Payer: Self-pay | Admitting: Family Medicine

## 2021-02-05 VITALS — BP 112/80 | HR 80 | Ht 64.0 in | Wt 112.0 lb

## 2021-02-05 DIAGNOSIS — F4323 Adjustment disorder with mixed anxiety and depressed mood: Secondary | ICD-10-CM

## 2021-02-05 DIAGNOSIS — R5383 Other fatigue: Secondary | ICD-10-CM

## 2021-02-05 MED ORDER — SERTRALINE HCL 25 MG PO TABS: 25.0000 mg | ORAL_TABLET | Freq: Every day | ORAL | 2 refills | Status: DC

## 2021-02-05 NOTE — Progress Notes (Signed)
Date:  02/05/2021   Name:  Brandi Montgomery   DOB:  1991-07-31   MRN:  [id number]   Chief Complaint: Depression and Anxiety  thy  Depression        This is a chronic problem.  The current episode started more than 1 month ago.   The onset quality is gradual.   The problem has been waxing and waning since onset.  Associated symptoms include decreased concentration, fatigue, irritable, decreased interest and sad.  Associated symptoms include no myalgias and no headaches.     The symptoms are aggravated by family issues.  Past treatments include SSRIs - Selective serotonin reuptake inhibitors.  Previous treatment provided moderate relief.  Past medical history includes thyroid problem and anxiety.   Anxiety Presents for follow-up visit. Symptoms include decreased concentration, depressed mood, excessive worry, irritability, nervous/anxious behavior, palpitations and panic. Patient reports no dizziness, nausea or shortness of breath. Symptoms occur occasionally. The severity of symptoms is mild.    Thyroid Problem Presents for follow-up visit. Symptoms include anxiety, depressed mood, fatigue, palpitations and tremors. Patient reports no cold intolerance, constipation, diarrhea, heat intolerance, menstrual problem, weight gain or weight loss. The symptoms have been stable.    Lab Results  Component Value Date   CREATININE 0.72 11/13/2018   BUN <5 (L) 11/13/2018   NA 136 11/13/2018   K 3.5 11/13/2018   CL 104 11/13/2018   CO2 26 11/13/2018   Lab Results  Component Value Date   CHOL 150 12/04/2013   HDL 52 12/04/2013   LDLCALC 87 12/04/2013   TRIG 57 12/04/2013   CHOLHDL 2.9 12/04/2013   Lab Results  Component Value Date   TSH 1.18 03/02/2014   No results found for: HGBA1C Lab Results  Component Value Date   WBC 3.5 (L) 11/13/2018   HGB 12.7 11/13/2018   HCT 38.1 11/13/2018   MCV 88.6 11/13/2018   PLT 144 (L) 11/13/2018   Lab Results  Component Value Date   ALT 145 (H)  11/13/2018   AST 133 (H) 11/13/2018   ALKPHOS 60 11/13/2018   BILITOT 0.6 11/13/2018     Review of Systems  Constitutional: Positive for fatigue and irritability. Negative for chills, fever, unexpected weight change, weight gain and weight loss.  HENT: Negative for congestion, ear discharge, ear pain, rhinorrhea, sinus pressure, sneezing and sore throat.   Eyes: Negative for photophobia, pain, discharge, redness and itching.  Respiratory: Negative for cough, shortness of breath, wheezing and stridor.   Cardiovascular: Positive for palpitations.  Gastrointestinal: Negative for abdominal pain, blood in stool, constipation, diarrhea, nausea and vomiting.  Endocrine: Negative for cold intolerance, heat intolerance, polydipsia, polyphagia and polyuria.  Genitourinary: Negative for dysuria, flank pain, frequency, hematuria, menstrual problem, pelvic pain, urgency, vaginal bleeding and vaginal discharge.  Musculoskeletal: Negative for arthralgias, back pain and myalgias.  Skin: Negative for rash.  Allergic/Immunologic: Negative for environmental allergies and food allergies.  Neurological: Positive for tremors. Negative for dizziness, weakness, light-headedness, numbness and headaches.  Hematological: Negative for adenopathy. Does not bruise/bleed easily.  Psychiatric/Behavioral: Positive for decreased concentration and depression. Negative for dysphoric mood. The patient is nervous/anxious.     Patient Active Problem List   Diagnosis Date Noted  . Biliary colic 11/12/2018  . Post term pregnancy at [redacted] weeks gestation 12/03/2016  . Post-dates pregnancy 12/01/2016  . Supervision of normal pregnancy 07/01/2016    No Known Allergies  Past Surgical History:  Procedure Laterality Date  . ANKLE SURGERY Left  2005  . CHOLECYSTECTOMY N/A 11/13/2018   Procedure: LAPAROSCOPIC CHOLECYSTECTOMY;  Surgeon: Sung Amabile, DO;  Location: [facility] ORS;  Service: General;  Laterality: N/A;    Social History    Tobacco Use  . Smoking status: Never Smoker  . Smokeless tobacco: Never Used  Vaping Use  . Vaping Use: Never used  Substance Use Topics  . Alcohol use: Yes    Alcohol/week: 0.0 standard drinks    Comment: wine, beer, liquor occassionally  . Drug use: No     Medication list has been reviewed and updated.  Current Meds  Medication Sig  . sertraline (ZOLOFT) 25 MG tablet Take 1 tablet (25 mg total) by mouth daily. Initiate at one half tablet q day for 2 weeks then 1 tablet a day.  . triamcinolone (KENALOG) 0.1 % Apply 1 application topically 2 (two) times daily.    PHQ 2/9 Scores 02/05/2021 01/08/2021  PHQ - 2 Score 4 4  PHQ- 9 Score 16 19    GAD 7 : Generalized Anxiety Score 02/05/2021 01/08/2021  Nervous, Anxious, on Edge 3 2  Control/stop worrying 3 3  Worry too much - different things 3 3  Trouble relaxing 3 3  Restless 3 2  Easily annoyed or irritable 3 3  Afraid - awful might happen 0 0  Total GAD 7 Score 18 16  Anxiety Difficulty Extremely difficult Very difficult    BP Readings from Last 3 Encounters:  02/05/21 112/80  01/29/21 113/73  01/08/21 120/74    Physical Exam Vitals and nursing note reviewed.  Constitutional:      General: She is irritable.     Appearance: She is well-developed.  HENT:     Head: Normocephalic.     Right Ear: External ear normal.     Left Ear: External ear normal.     Nose: Nose normal.     Mouth/Throat:     Mouth: Mucous membranes are moist.  Eyes:     General: Lids are everted, no foreign bodies appreciated. No scleral icterus.       Left eye: No foreign body or hordeolum.     Conjunctiva/sclera: Conjunctivae normal.     Right eye: Right conjunctiva is not injected.     Left eye: Left conjunctiva is not injected.     Pupils: Pupils are equal, round, and reactive to light.  Neck:     Thyroid: No thyromegaly.     Vascular: No JVD.     Trachea: No tracheal deviation.  Cardiovascular:     Rate and Rhythm: Normal rate and  regular rhythm.     Heart sounds: Normal heart sounds. No murmur heard. No friction rub. No gallop.   Pulmonary:     Effort: Pulmonary effort is normal. No respiratory distress.     Breath sounds: Normal breath sounds. No wheezing, rhonchi or rales.  Abdominal:     General: Bowel sounds are normal.     Palpations: Abdomen is soft. There is no mass.     Tenderness: There is no abdominal tenderness. There is no guarding or rebound.  Musculoskeletal:        General: No tenderness. Normal range of motion.     Cervical back: Normal range of motion and neck supple.  Lymphadenopathy:     Cervical: No cervical adenopathy.  Skin:    General: Skin is warm.     Findings: No rash.  Neurological:     Mental Status: She is alert and oriented to person, place,  and time.     Cranial Nerves: No cranial nerve deficit.     Deep Tendon Reflexes: Reflexes normal.  Psychiatric:        Mood and Affect: Mood is not anxious or depressed.     Wt Readings from Last 3 Encounters:  02/05/21 112 lb (50.8 kg)  01/29/21 110 lb (49.9 kg)  01/08/21 106 lb (48.1 kg)    BP 112/80   Pulse 80   Ht 5\' 4"  (1.626 m)   Wt 112 lb (50.8 kg)   LMP 01/25/2021   BMI 19.22 kg/m   Assessment and Plan: 1. 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  Uncontrolled.  Stable.  PHQ went from 18-16\18 but patient is currently on the 25 mg dosing.  Her question was whether or not the increase in diaphoresis may be related to the sertraline and I rather doubt it at this dosing so I would like to continue but we will be looking in other directions at this time.  2. Fatigue, unspecified type New onset.  Patient's been having more fatigue and with diaphoresis and palpitations we will check a thyroid panel.  I am thinking that this may be also panic possibilities and hoping that the 25 mg increase will be sufficient. - Thyroid Panel With TSH

## 2021-02-24 ENCOUNTER — Ambulatory Visit: Payer: Self-pay | Admitting: Family Medicine

## 2021-05-06 ENCOUNTER — Ambulatory Visit: Payer: Self-pay | Admitting: Family Medicine

## 2021-05-12 ENCOUNTER — Other Ambulatory Visit: Payer: Self-pay | Admitting: Family Medicine

## 2021-05-12 DIAGNOSIS — F4323 Adjustment disorder with mixed anxiety and depressed mood: Secondary | ICD-10-CM

## 2021-05-14 ENCOUNTER — Other Ambulatory Visit: Payer: Self-pay

## 2021-05-14 DIAGNOSIS — F4323 Adjustment disorder with mixed anxiety and depressed mood: Secondary | ICD-10-CM

## 2021-05-14 MED ORDER — SERTRALINE HCL 25 MG PO TABS: 25.0000 mg | ORAL_TABLET | Freq: Every day | ORAL | 0 refills | Status: DC

## 2021-05-22 ENCOUNTER — Ambulatory Visit: Payer: Self-pay | Admitting: Family Medicine

## 2021-07-29 ENCOUNTER — Encounter: Payer: Self-pay | Admitting: Family Medicine

## 2021-11-06 ENCOUNTER — Ambulatory Visit: Payer: Self-pay | Admitting: Family Medicine

## 2021-11-10 ENCOUNTER — Ambulatory Visit: Payer: Managed Care, Other (non HMO) | Admitting: Adult Health

## 2021-11-10 ENCOUNTER — Other Ambulatory Visit: Payer: Self-pay

## 2021-11-10 ENCOUNTER — Telehealth: Payer: Self-pay | Admitting: Adult Health

## 2021-11-10 ENCOUNTER — Encounter: Payer: Self-pay | Admitting: Adult Health

## 2021-11-10 VITALS — BP 132/80 | HR 72 | Temp 98.6°F | Resp 17

## 2021-11-10 DIAGNOSIS — N200 Calculus of kidney: Secondary | ICD-10-CM | POA: Diagnosis not present

## 2021-11-10 DIAGNOSIS — Z13220 Encounter for screening for lipoid disorders: Secondary | ICD-10-CM

## 2021-11-10 DIAGNOSIS — K219 Gastro-esophageal reflux disease without esophagitis: Secondary | ICD-10-CM | POA: Diagnosis not present

## 2021-11-10 DIAGNOSIS — F319 Bipolar disorder, unspecified: Secondary | ICD-10-CM | POA: Diagnosis not present

## 2021-11-10 DIAGNOSIS — R61 Generalized hyperhidrosis: Secondary | ICD-10-CM | POA: Diagnosis not present

## 2021-11-10 DIAGNOSIS — Z1389 Encounter for screening for other disorder: Secondary | ICD-10-CM

## 2021-11-10 DIAGNOSIS — Z9049 Acquired absence of other specified parts of digestive tract: Secondary | ICD-10-CM

## 2021-11-10 MED ORDER — OMEPRAZOLE 20 MG PO CPDR: 20.0000 mg | DELAYED_RELEASE_CAPSULE | Freq: Every day | ORAL | 2 refills | Status: DC

## 2021-11-10 NOTE — Patient Instructions (Addendum)
[facility] 2.7 (16)  Psychiatrist [place], [place]  [phone] Open ? Closes 5:30?PM Medicare/Medicaid accepted  Health Maintenance, Female Adopting a healthy lifestyle and getting preventive care are important in promoting health and wellness. Ask your health care provider about: The right schedule for you to have regular tests and exams. Things you can do on your own to prevent diseases and keep yourself healthy. What should I know about diet, weight, and exercise? Eat a healthy diet  Eat a diet that includes plenty of vegetables, fruits, low-fat dairy products, and lean protein. Do not eat a lot of foods that are high in solid fats, added sugars, or sodium. Maintain a healthy weight Body mass index (BMI) is used to identify weight problems. It estimates body fat based on height and weight. Your health care provider can help determine your BMI and help you achieve or maintain a healthy weight. Get regular exercise Get regular exercise. This is one of the most important things you can do for your health. Most adults should: Exercise for at least 150 minutes each week. The exercise should increase your heart rate and make you sweat (moderate-intensity exercise). Do strengthening exercises at least twice a week. This is in addition to the moderate-intensity exercise. Spend less time sitting. Even light physical activity can be beneficial. Watch cholesterol and blood lipids Have your blood tested for lipids and cholesterol at 32 years of age, then have this test every 5 years. Have your cholesterol levels checked more often if: Your lipid or cholesterol levels are high. You are older than 32 years of age. You are at high risk for heart disease. What should I know about cancer screening? Depending on your health history and family history, you may need to have cancer screening at various ages. This may include screening for: Breast cancer. Cervical  cancer. Colorectal cancer. Skin cancer. Lung cancer. What should I know about heart disease, diabetes, and high blood pressure? Blood pressure and heart disease High blood pressure causes heart disease and increases the risk of stroke. This is more likely to develop in people who have high blood pressure readings or are overweight. Have your blood pressure checked: Every 3-5 years if you are 61-52 years of age. Every year if you are 47 years old or older. Diabetes Have regular diabetes screenings. This checks your fasting blood sugar level. Have the screening done: Once every three years after age 27 if you are at a normal weight and have a low risk for diabetes. More often and at a younger age if you are overweight or have a high risk for diabetes. What should I know about preventing infection? Hepatitis B If you have a higher risk for hepatitis B, you should be screened for this virus. Talk with your health care provider to find out if you are at risk for hepatitis B infection. Hepatitis C Testing is recommended for: Everyone born from [birth year] through 1966. Anyone with known risk factors for hepatitis C. Sexually transmitted infections (STIs) Get screened for STIs, including gonorrhea and chlamydia, if: You are sexually active and are younger than 32 years of age. You are older than 32 years of age and your health care provider tells you that you are at risk for this type of infection. Your sexual activity has changed since you were last screened, and you are at increased risk for chlamydia or gonorrhea. Ask your health care provider if you are at risk. Ask your health care provider about  whether you are at high risk for HIV. Your health care provider may recommend a prescription medicine to help prevent HIV infection. If you choose to take medicine to prevent HIV, you should first get tested for HIV. You should then be tested every 3 months for as long as you are taking the  medicine. Pregnancy If you are about to stop having your period (premenopausal) and you may become pregnant, seek counseling before you get pregnant. Take 400 to 800 micrograms (mcg) of folic acid every day if you become pregnant. Ask for birth control (contraception) if you want to prevent pregnancy. Osteoporosis and menopause Osteoporosis is a disease in which the bones lose minerals and strength with aging. This can result in bone fractures. If you are 33 years old or older, or if you are at risk for osteoporosis and fractures, ask your health care provider if you should: Be screened for bone loss. Take a calcium or vitamin D supplement to lower your risk of fractures. Be given hormone replacement therapy (HRT) to treat symptoms of menopause. Follow these instructions at home: Alcohol use Do not drink alcohol if: Your health care provider tells you not to drink. You are pregnant, may be pregnant, or are planning to become pregnant. If you drink alcohol: Limit how much you have to: 0-1 drink a day. Know how much alcohol is in your drink. In the [place] one drink equals one 12 oz bottle of beer (355 mL), one 5 oz glass of wine (148 mL), or one 1 oz glass of hard liquor (44 mL). Lifestyle Do not use any products that contain nicotine or tobacco. These products include cigarettes, chewing tobacco, and vaping devices, such as e-cigarettes. If you need help quitting, ask your health care provider. Do not use street drugs. Do not share needles. Ask your health care provider for help if you need support or information about quitting drugs. General instructions Schedule regular health, dental, and eye exams. Stay current with your vaccines. Tell your health care provider if: You often feel depressed. You have ever been abused or do not feel safe at home. Summary Adopting a healthy lifestyle and getting preventive care are important in promoting health and wellness. Follow your health care  provider's instructions about healthy diet, exercising, and getting tested or screened for diseases. Follow your health care provider's instructions on monitoring your cholesterol and blood pressure. This information is not intended to replace advice given to you by your health care provider. Make sure you discuss any questions you have with your health care provider. Document Revised: 03/02/2021 Document Reviewed: 03/02/2021 Elsevier Patient Education  2022 [employer].

## 2021-11-10 NOTE — Progress Notes (Signed)
New Patient Office Visit  Subjective:  Patient ID: Brandi Montgomery, female    DOB: Sep 20, 1991  Age: 31 y.o. MRN: [id number]  CC:  Chief Complaint  Patient presents with   New Patient (Initial Visit)    HPI Brandi Montgomery presents for new patient/ establishment of care.  She has some ear pain for 2 days.  She has had some throat irritation with swallowing. Denies any reflux/ acid.  Denies any choking or problems swallowing  Occasional burning in the stomach.   On Wellbutrin she reports for Bipolar, stable no manic or depressive episodes. Denies any homicidal or suicidal ideations or intents. Will need referral to psychology. OBGYN is Dr. Thomasene Mohair MD PAP 04/30/21  Patient  denies any fever, body aches,chills, rash, chest pain, shortness of breath, nausea, vomiting, or diarrhea.  Denies dizziness, lightheadedness, pre syncopal or syncopal episodes.  Patient's last menstrual period was 11/02/2021. Not breastfeeding.   History reviewed. No pertinent past medical history.   Past Surgical History:  Procedure Laterality Date   ANKLE SURGERY Left 2005   CHOLECYSTECTOMY N/A 11/13/2018   Procedure: LAPAROSCOPIC CHOLECYSTECTOMY;  Surgeon: Sung Amabile, DO;  Location: [facility] ORS;  Service: General;  Laterality: N/A;    Family History  Problem Relation Age of Onset   Stroke Mother 14       Blood clot related to diet pills.   Hypertension Mother    Cancer Maternal Grandmother 11       Breast cancer   Diabetes Maternal Grandmother    Hypertension Maternal Grandmother     Social History   Socioeconomic History   Marital status: Married    Spouse name: Mellody Dance   Number of children: 2   Years of education: Not on file   Highest education level: Not on file  Occupational History   Not on file  Tobacco Use   Smoking status: Never   Smokeless tobacco: Never  Vaping Use   Vaping Use: Never used  Substance and Sexual Activity   Alcohol use: Yes    Alcohol/week: 0.0  standard drinks    Comment: wine, beer, liquor occassionally   Drug use: No   Sexual activity: Yes    Partners: Male    Birth control/protection: Surgical    Comment: vasectomy  Other Topics Concern   Not on file  Social History Narrative   Divorced   High school   Works as a Economist         Social Determinants of Corporate investment banker Strain: Not on file  Food Insecurity: Not on file  Transportation Needs: Not on file  Physical Activity: Not on file  Stress: Not on file  Social Connections: Not on file  Intimate Partner Violence: Not on file    ROS Review of Systems  Constitutional: Negative.   HENT:  Positive for ear pain (mostly fullness feeling.). Negative for congestion, dental problem, drooling and ear discharge.   Respiratory: Negative.    Cardiovascular: Negative.   Gastrointestinal: Negative.   Genitourinary: Negative.   Musculoskeletal: Negative.   Neurological: Negative.   Psychiatric/Behavioral: Negative.     Objective:   Today's Vitals: BP 132/80    Pulse 72    Temp 98.6 F (37 C)    Resp 17    LMP 11/02/2021    SpO2 99%    Breastfeeding No   Physical Exam Constitutional:      Appearance: Normal appearance. She is not ill-appearing or diaphoretic.  HENT:     Right Ear: Tympanic membrane, ear canal and external ear normal. There is no impacted cerumen.     Left Ear: Tympanic membrane, ear canal and external ear normal. There is no impacted cerumen.     Nose: Nose normal.     Mouth/Throat:     Mouth: Mucous membranes are moist.     Pharynx: Oropharynx is clear. No oropharyngeal exudate or posterior oropharyngeal erythema.  Eyes:     Pupils: Pupils are equal, round, and reactive to light.  Abdominal:     General: There is no distension.     Palpations: Abdomen is soft.     Tenderness: There is no abdominal tenderness.    General: Appearance:    Thin female in no acute distress  Eyes:    PERRL, conjunctiva/corneas clear, EOM's  intact       Lungs:     Clear to auscultation bilaterally, respirations unlabored  Heart:    Normal heart rate. Normal rhythm. No murmurs, rubs, or gallops.    MS:   All extremities are intact.    Neurologic:   Awake, alert, oriented x 3. No apparent focal neurological           defect.     Assessment & Plan:   Problem List Items Addressed This Visit       Digestive   Gastroesophageal reflux disease - Primary   Relevant Medications   omeprazole (PRILOSEC) 20 MG capsule     Genitourinary   Kidney stones    Kidney function is normal, history of kidney stones she has passed in the past.         Other   Screening cholesterol level   Relevant Orders   Lipid panel (Completed)   History of cholecystectomy   Bipolar 1 disorder (HCC)   Relevant Orders   Ambulatory referral to Psychiatry   Urinalysis, Routine w reflex microscopic (Completed)   Excessive sweating   Relevant Orders   CBC with Differential/Platelet (Completed)   Comprehensive metabolic panel (Completed)   TSH (Completed)    Orders Placed This Encounter  Procedures   CBC with Differential/Platelet   Comprehensive metabolic panel   TSH   Lipid panel   Urinalysis, Routine w reflex microscopic   Ambulatory referral to Psychiatry    Referral Priority:   Routine    Referral Type:   Psychiatric    Referral Reason:   Specialty Services Required    Requested Specialty:   Psychiatry    Number of Visits Requested:   1     Recommend trying Claritin or equivalent and Flonase nasal spray for suspected allergies. Ears are within normal limits.   Outpatient Encounter Medications as of 11/10/2021  Medication Sig   buPROPion (WELLBUTRIN XL) 300 MG 24 hr tablet    busPIRone (BUSPAR) 15 MG tablet    omeprazole (PRILOSEC) 20 MG capsule Take 1 capsule (20 mg total) by mouth daily.   acetaminophen (TYLENOL) 325 MG tablet Take 650 mg by mouth every 6 (six) hours as needed. (Patient not taking: No sig reported)   sertraline  (ZOLOFT) 25 MG tablet Take 1 tablet (25 mg total) by mouth daily.   triamcinolone (KENALOG) 0.1 % Apply 1 application topically 2 (two) times daily.   No facility-administered encounter medications on file as of 11/10/2021.  Will trial PPI for possible GERD if no improvement or worsening will refer to Gastroenterology.   Red Flags discussed. The patient was given clear instructions to go to  ER or return to medical center if any red flags develop, symptoms do not improve, worsen or new problems develop. They verbalized understanding.   Follow-up: Return in about 1 month (around 12/11/2021), or if symptoms worsen or fail to improve, for at any time for any worsening symptoms, Go to Emergency room/ urgent care if worse.   Jairo BenMichelle Smith Lacharles Altschuler, FNP

## 2021-11-10 NOTE — Assessment & Plan Note (Signed)
Kidney function is normal, history of kidney stones she has passed in the past.

## 2021-11-10 NOTE — Telephone Encounter (Signed)
Lft pt vm to call ofc regarding referral. thanks 

## 2021-11-11 DIAGNOSIS — F319 Bipolar disorder, unspecified: Secondary | ICD-10-CM | POA: Insufficient documentation

## 2021-11-11 DIAGNOSIS — K219 Gastro-esophageal reflux disease without esophagitis: Secondary | ICD-10-CM | POA: Insufficient documentation

## 2021-11-11 DIAGNOSIS — R61 Generalized hyperhidrosis: Secondary | ICD-10-CM | POA: Insufficient documentation

## 2021-11-11 LAB — COMPREHENSIVE METABOLIC PANEL
ALT: 10 U/L (ref 0–35)
AST: 14 U/L (ref 0–37)
Albumin: 4.7 g/dL (ref 3.5–5.2)
Alkaline Phosphatase: 52 U/L (ref 39–117)
BUN: 6 mg/dL (ref 6–23)
CO2: 24 mEq/L (ref 19–32)
Calcium: 9.5 mg/dL (ref 8.4–10.5)
Chloride: 105 mEq/L (ref 96–112)
Creatinine, Ser: 0.84 mg/dL (ref 0.40–1.20)
GFR: 92.52 mL/min (ref >60.00)
Glucose, Bld: 85 mg/dL (ref 70–99)
Potassium: 4 mEq/L (ref 3.5–5.1)
Sodium: 138 mEq/L (ref 135–145)
Total Bilirubin: 0.5 mg/dL (ref 0.2–1.2)
Total Protein: 7.2 g/dL (ref 6.0–8.3)

## 2021-11-11 LAB — TSH: TSH: 1.33 u[IU]/mL (ref 0.35–5.50)

## 2021-11-11 LAB — CBC WITH DIFFERENTIAL/PLATELET
Basophils Absolute: 0.1 10*3/uL (ref 0.0–0.1)
Basophils Relative: 1.4 % (ref 0.0–3.0)
Eosinophils Absolute: 0.1 10*3/uL (ref 0.0–0.7)
Eosinophils Relative: 1.8 % (ref 0.0–5.0)
HCT: 40.4 % (ref 36.0–46.0)
Hemoglobin: 13.6 g/dL (ref 12.0–15.0)
Lymphocytes Relative: 29.3 % (ref 12.0–46.0)
Lymphs Abs: 1.9 10*3/uL (ref 0.7–4.0)
MCHC: 33.5 g/dL (ref 30.0–36.0)
MCV: 90.5 fl (ref 78.0–100.0)
Monocytes Absolute: 0.4 10*3/uL (ref 0.1–1.0)
Monocytes Relative: 6.3 % (ref 3.0–12.0)
Neutro Abs: 4 10*3/uL (ref 1.4–7.7)
Neutrophils Relative %: 61.2 % (ref 43.0–77.0)
Platelets: 235 10*3/uL (ref 150.0–400.0)
RBC: 4.47 Mil/uL (ref 3.87–5.11)
RDW: 12.7 % (ref 11.5–15.5)
WBC: 6.5 10*3/uL (ref 4.0–10.5)

## 2021-11-11 LAB — LIPID PANEL
Cholesterol: 192 mg/dL (ref 0–200)
HDL: 55.8 mg/dL (ref >39.00)
LDL Cholesterol: 118 mg/dL — ABNORMAL HIGH (ref 0–99)
NonHDL: 136.36
Total CHOL/HDL Ratio: 3
Triglycerides: 92 mg/dL (ref 0.0–149.0)
VLDL: 18.4 mg/dL (ref 0.0–40.0)

## 2021-11-11 LAB — URINALYSIS, ROUTINE W REFLEX MICROSCOPIC
Bilirubin Urine: NEGATIVE
Hgb urine dipstick: NEGATIVE
Ketones, ur: NEGATIVE
Leukocytes,Ua: NEGATIVE
Nitrite: NEGATIVE
RBC / HPF: NONE SEEN (ref 0–?)
Specific Gravity, Urine: 1.01 (ref 1.000–1.030)
Total Protein, Urine: NEGATIVE
Urine Glucose: NEGATIVE
Urobilinogen, UA: 0.2 (ref 0.0–1.0)
WBC, UA: NONE SEEN (ref 0–?)
pH: 6.5 (ref 5.0–8.0)

## 2021-11-12 NOTE — Progress Notes (Signed)
lDL bad cholesterol elevated.  Discuss lifestyle modification with patient e.g. increase exercise, fiber, fruits, vegetables, lean meat, and omega 3/fish intake and decrease saturated fat.  If patient following strict diet and exercise program already please schedule follow up appointment with primary care physician  CBC within normal limits.  TSH for thyroid within normal limits.  CMP is within normal limits.  Urinalysis is within normal limits.

## 2021-12-14 ENCOUNTER — Encounter: Payer: Self-pay | Admitting: Adult Health

## 2021-12-14 ENCOUNTER — Ambulatory Visit: Payer: Managed Care, Other (non HMO) | Admitting: Adult Health

## 2021-12-17 ENCOUNTER — Encounter: Payer: Self-pay | Admitting: Adult Health

## 2021-12-17 NOTE — Telephone Encounter (Signed)
Spoke to Patient, explained to her that in order to get new medication she would have to come into the office. Patient verbalized that she  understands and would just keep her appointment for next week Thursday Mar 2nd.

## 2021-12-24 ENCOUNTER — Ambulatory Visit: Payer: Managed Care, Other (non HMO) | Admitting: Adult Health

## 2021-12-24 ENCOUNTER — Encounter: Payer: Self-pay | Admitting: Internal Medicine

## 2021-12-24 ENCOUNTER — Ambulatory Visit: Payer: Managed Care, Other (non HMO) | Admitting: Internal Medicine

## 2021-12-24 ENCOUNTER — Other Ambulatory Visit: Payer: Self-pay

## 2021-12-24 VITALS — BP 118/86 | HR 102 | Temp 98.4°F | Ht 64.0 in | Wt 125.5 lb

## 2021-12-24 DIAGNOSIS — Z803 Family history of malignant neoplasm of breast: Secondary | ICD-10-CM | POA: Diagnosis not present

## 2021-12-24 DIAGNOSIS — F419 Anxiety disorder, unspecified: Secondary | ICD-10-CM

## 2021-12-24 DIAGNOSIS — F32A Depression, unspecified: Secondary | ICD-10-CM | POA: Diagnosis not present

## 2021-12-24 MED ORDER — BUPROPION HCL ER (XL) 300 MG PO TB24: 300.0000 mg | ORAL_TABLET | Freq: Every day | ORAL | 3 refills | Status: DC

## 2021-12-24 MED ORDER — BUPROPION HCL ER (XL) 300 MG PO TB24: 300.0000 mg | ORAL_TABLET | Freq: Every day | ORAL | 0 refills | Status: DC

## 2021-12-24 MED ORDER — BUPROPION HCL ER (XL) 150 MG PO TB24: 150.0000 mg | ORAL_TABLET | Freq: Every day | ORAL | 0 refills | Status: DC

## 2021-12-24 NOTE — Progress Notes (Signed)
Chief Complaint  Patient presents with   Medication Refill   F/u  1. Anxiety>depression gad 7 21, phq 9 6 off wellbutrin since Monday due to needed appt for refill on wellbutrin xl 300 mg qd had taken celexa, lexapro, zoloft,buspar did not help appt 02/07/22 with Dr[facility]  Her mom dx'ed breast cancer 49/50   Review of Systems  Constitutional:  Negative for weight loss.  HENT:  Negative for hearing loss.   Eyes:  Negative for blurred vision.  Respiratory:  Negative for shortness of breath.   Cardiovascular:  Negative for chest pain.  Gastrointestinal:  Negative for abdominal pain and blood in stool.  Genitourinary:  Negative for dysuria.  Musculoskeletal:  Negative for falls and joint pain.  Skin:  Negative for rash.  Neurological:  Negative for headaches.  Psychiatric/Behavioral:  Positive for depression. The patient is nervous/anxious.   No past medical history on file. Past Surgical History:  Procedure Laterality Date   ANKLE SURGERY Left 2005   CHOLECYSTECTOMY N/A 11/13/2018   Procedure: LAPAROSCOPIC CHOLECYSTECTOMY;  Surgeon: Sung AmabileSakai, Isami, DO;  Location: [facility] ORS;  Service: General;  Laterality: N/A;   Family History  Problem Relation Age of Onset   Cancer Mother        breast cancer   Stroke Mother [id number]       Blood clot related to diet pills.   Hypertension Mother    Cancer Maternal Grandmother [phone]       Breast cancer   Diabetes Maternal Grandmother    Hypertension Maternal Grandmother    Social History   Socioeconomic History   Marital status: Married    Spouse name: Mellody DanceKeith   Number of children: 2   Years of education: Not on file   Highest education level: Not on file  Occupational History   Not on file  Tobacco Use   Smoking status: Never   Smokeless tobacco: Never  Vaping Use   Vaping Use: Never used  Substance and Sexual Activity   Alcohol use: Yes    Alcohol/week: 0.0 standard drinks    Comment: wine, beer, liquor occassionally   Drug use: No    Sexual activity: Yes    Partners: Male    Birth control/protection: Surgical    Comment: vasectomy  Other Topics Concern   Not on file  Social History Narrative   Divorced   High school   Works as a Economistserver   Exercises         Social Determinants of Corporate investment bankerHealth   Financial Resource Strain: Not on file  Food Insecurity: Not on file  Transportation Needs: Not on file  Physical Activity: Not on file  Stress: Not on file  Social Connections: Not on file  Intimate Partner Violence: Not on file   Current Meds  Medication Sig   buPROPion (WELLBUTRIN XL) 300 MG 24 hr tablet Take 1 tablet (300 mg total) by mouth daily. Dispense 300 mg xl   [DISCONTINUED] buPROPion (WELLBUTRIN XL) 150 MG 24 hr tablet Take 1 tablet (150 mg total) by mouth daily.   [DISCONTINUED] buPROPion (WELLBUTRIN XL) 300 MG 24 hr tablet    No Known Allergies Recent Results (from the past 2160 hour(s))  CBC with Differential/Platelet     Status: None   Collection Time: 11/10/21  2:51 PM  Result Value Ref Range   WBC 6.5 4.0 - 10.5 K/uL   RBC 4.47 3.87 - 5.11 Mil/uL   Hemoglobin 13.6 12.0 - 15.0 g/dL   HCT 40.4  36.0 - 46.0 %   MCV 90.5 78.0 - 100.0 fl   MCHC 33.5 30.0 - 36.0 g/dL   RDW 23.5 57.3 - 22.0 %   Platelets 235.0 150.0 - 400.0 K/uL   Neutrophils Relative % 61.2 43.0 - 77.0 %   Lymphocytes Relative 29.3 12.0 - 46.0 %   Monocytes Relative 6.3 3.0 - 12.0 %   Eosinophils Relative 1.8 0.0 - 5.0 %   Basophils Relative 1.4 0.0 - 3.0 %   Neutro Abs 4.0 1.4 - 7.7 K/uL   Lymphs Abs 1.9 0.7 - 4.0 K/uL   Monocytes Absolute 0.4 0.1 - 1.0 K/uL   Eosinophils Absolute 0.1 0.0 - 0.7 K/uL   Basophils Absolute 0.1 0.0 - 0.1 K/uL  Comprehensive metabolic panel     Status: None   Collection Time: 11/10/21  2:51 PM  Result Value Ref Range   Sodium 138 135 - 145 mEq/L   Potassium 4.0 3.5 - 5.1 mEq/L   Chloride 105 96 - 112 mEq/L   CO2 24 19 - 32 mEq/L   Glucose, Bld 85 70 - 99 mg/dL   BUN 6 6 - 23 mg/dL    Creatinine, Ser 2.54 0.40 - 1.20 mg/dL   Total Bilirubin 0.5 0.2 - 1.2 mg/dL   Alkaline Phosphatase 52 39 - 117 U/L   AST 14 0 - 37 U/L   ALT 10 0 - 35 U/L   Total Protein 7.2 6.0 - 8.3 g/dL   Albumin 4.7 3.5 - 5.2 g/dL   GFR 27.06 >23.76 mL/min    Comment: Calculated using the CKD-EPI Creatinine Equation (2021)   Calcium 9.5 8.4 - 10.5 mg/dL  TSH     Status: None   Collection Time: 11/10/21  2:51 PM  Result Value Ref Range   TSH 1.33 0.35 - 5.50 uIU/mL  Lipid panel     Status: Abnormal   Collection Time: 11/10/21  2:51 PM  Result Value Ref Range   Cholesterol 192 0 - 200 mg/dL    Comment: ATP III Classification       Desirable:  < 200 mg/dL               Borderline High:  200 - 239 mg/dL          High:  > = 283 mg/dL   Triglycerides 15.1 0.0 - 149.0 mg/dL    Comment: Normal:  <761 mg/dLBorderline High:  150 - 199 mg/dL   HDL 60.73 >71.06 mg/dL   VLDL 26.9 0.0 - 48.5 mg/dL   LDL Cholesterol 462 (H) 0 - 99 mg/dL   Total CHOL/HDL Ratio 3     Comment:                Men          Women1/2 Average Risk     3.4          3.3Average Risk          5.0          4.42X Average Risk          9.6          7.13X Average Risk          15.0          11.0                       NonHDL 136.36     Comment: NOTE:  Non-HDL goal should be 30 mg/dL  higher than patient's LDL goal (i.e. LDL goal of < 70 mg/dL, would have non-HDL goal of < 100 mg/dL)  Urinalysis, Routine w reflex microscopic     Status: None   Collection Time: 11/10/21  2:51 PM  Result Value Ref Range   Color, Urine YELLOW Yellow;Lt. Yellow;Straw;Dark Yellow;Amber;Green;Red;Brown   APPearance CLEAR Clear;Turbid;Slightly Cloudy;Cloudy   Specific Gravity, Urine 1.010 1.000 - 1.030   pH 6.5 5.0 - 8.0   Total Protein, Urine NEGATIVE Negative   Urine Glucose NEGATIVE Negative   Ketones, ur NEGATIVE Negative   Bilirubin Urine NEGATIVE Negative   Hgb urine dipstick NEGATIVE Negative   Urobilinogen, UA 0.2 0.0 - 1.0   Leukocytes,Ua NEGATIVE  Negative   Nitrite NEGATIVE Negative   WBC, UA none seen 0-2/hpf   RBC / HPF none seen 0-2/hpf   Squamous Epithelial / LPF Rare(0-4/hpf) Rare(0-4/hpf)   Objective  Body mass index is 21.54 kg/m. Wt Readings from Last 3 Encounters:  12/24/21 125 lb 8 oz (56.9 kg)  02/05/21 112 lb (50.8 kg)  01/29/21 110 lb (49.9 kg)   Temp Readings from Last 3 Encounters:  12/24/21 98.4 F (36.9 C) (Oral)  11/10/21 98.6 F (37 C)  11/14/18 98.3 F (36.8 C) (Oral)   BP Readings from Last 3 Encounters:  12/24/21 118/86  11/10/21 132/80  02/05/21 112/80   Pulse Readings from Last 3 Encounters:  12/24/21 (!) 102  11/10/21 72  02/05/21 80    Physical Exam Vitals and nursing note reviewed.  Constitutional:      Appearance: Normal appearance. She is well-developed and well-groomed.  HENT:     Head: Normocephalic and atraumatic.  Eyes:     Conjunctiva/sclera: Conjunctivae normal.     Pupils: Pupils are equal, round, and reactive to light.  Cardiovascular:     Rate and Rhythm: Normal rate and regular rhythm.     Heart sounds: Normal heart sounds. No murmur heard. Pulmonary:     Effort: Pulmonary effort is normal.     Breath sounds: Normal breath sounds.  Abdominal:     General: Abdomen is flat. Bowel sounds are normal.     Tenderness: There is no abdominal tenderness.  Musculoskeletal:        General: No tenderness.  Skin:    General: Skin is warm and dry.  Neurological:     General: No focal deficit present.     Mental Status: She is alert and oriented to person, place, and time. Mental status is at baseline.     Cranial Nerves: Cranial nerves 2-12 are intact.     Gait: Gait is intact.  Psychiatric:        Attention and Perception: Attention and perception normal.        Mood and Affect: Mood and affect normal.        Speech: Speech normal.        Behavior: Behavior normal. Behavior is cooperative.        Thought Content: Thought content normal.        Cognition and Memory:  Cognition and memory normal.        Judgment: Judgment normal.     Comments: Tearful on exam    Assessment  Plan  Anxiety and depression - Plan: buPROPion (WELLBUTRIN XL) 300 MG 24 hr tablet, qd to [facility], and optum F/u Dr. Maryruth Bun 02/07/22  Gad 7 21, phq9 score 6    FH: breast cancer  Consider genetic testing with ob/gyn Dr. Jean Rosenthal and baseline mammogram  at 35-39  Provider: Dr. French Ana McLean-Scocuzza-Internal Medicine

## 2021-12-24 NOTE — Patient Instructions (Signed)
[facility] as given the info before for both  [facility] counseling and psychiatry [place]  [address]  [place] [address] [phone]    [facility] [place]  [address]  [place] [address]  [phone]   Managing Anxiety, Adult After being diagnosed with anxiety, you may be relieved to know why you have felt or behaved a certain way. You may also feel overwhelmed about the treatment ahead and what it will mean for your life. With care and support, you can manage this condition. How to manage lifestyle changes Managing stress and anxiety Stress is your body's reaction to life changes and events, both good and bad. Most stress will last just a few hours, but stress can be ongoing and can lead to more than just stress. Although stress can play a major role in anxiety, it is not the same as anxiety. Stress is usually caused by something external, such as a deadline, test, or competition. Stress normally passes after the triggering event has ended.  Anxiety is caused by something internal, such as imagining a terrible outcome or worrying that something will go wrong that will devastate you. Anxiety often does not go away even after the triggering event is over, and it can become long-term (chronic) worry. It is important to understand the differences between stress and anxiety and to manage your stress effectively so that it does not lead to an anxious response. Talk with your health care provider or a counselor to learn more about reducing anxiety and stress. He or she may suggest tension reduction techniques, such as: Music therapy. Spend time creating or listening to music that you enjoy and that inspires you. Mindfulness-based meditation. Practice being aware of your normal breaths while not trying to control your breathing. It can be done while sitting or walking. Centering prayer. This involves focusing on a word, phrase, or  sacred image that means something to you and brings you peace. Deep breathing. To do this, expand your stomach and inhale slowly through your nose. Hold your breath for 3-5 seconds. Then exhale slowly, letting your stomach muscles relax. Self-talk. Learn to notice and identify thought patterns that lead to anxiety reactions and change those patterns to thoughts that feel peaceful. Muscle relaxation. Taking time to tense muscles and then relax them. Choose a tension reduction technique that fits your lifestyle and personality. These techniques take time and practice. Set aside 5-15 minutes a day to do them. Therapists can offer counseling and training in these techniques. The training to help with anxiety may be covered by some insurance plans. Other things you can do to manage stress and anxiety include: Keeping a stress diary. This can help you learn what triggers your reaction and then learn ways to manage your response. Thinking about how you react to certain situations. You may not be able to control everything, but you can control your response. Making time for activities that help you relax and not feeling guilty about spending your time in this way. Doing visual imagery. This involves imagining or creating mental pictures to help you relax. Practicing yoga. Through yoga poses, you can lower tension and promote relaxation.  Medicines Medicines can help ease symptoms. Medicines for anxiety include: Antidepressant medicines. These are usually prescribed for long-term daily control. Anti-anxiety medicines. These may be added in severe cases, especially when panic attacks occur. Medicines will be prescribed by a health care provider. When used together, medicines, psychotherapy, and tension  reduction techniques may be the most effective treatment. Relationships Relationships can play a big part in helping you recover. Try to spend more time connecting with trusted friends and family  members. Consider going to couples counseling if you have a partner, taking family education classes, or going to family therapy. Therapy can help you and others better understand your condition. How to recognize changes in your anxiety Everyone responds differently to treatment for anxiety. Recovery from anxiety happens when symptoms decrease and stop interfering with your daily activities at home or work. This may mean that you will start to: Have better concentration and focus. Worry will interfere less in your daily thinking. Sleep better. Be less irritable. Have more energy. Have improved memory. It is also important to recognize when your condition is getting worse. Contact your health care provider if your symptoms interfere with home or work and you feel like your condition is not improving. Follow these instructions at home: Activity Exercise. Adults should do the following: Exercise for at least 150 minutes each week. The exercise should increase your heart rate and make you sweat (moderate-intensity exercise). Strengthening exercises at least twice a week. Get the right amount and quality of sleep. Most adults need 7-9 hours of sleep each night. Lifestyle  Eat a healthy diet that includes plenty of vegetables, fruits, whole grains, low-fat dairy products, and lean protein. Do not eat a lot of foods that are high in fats, added sugars, or salt (sodium). Make choices that simplify your life. Do not use any products that contain nicotine or tobacco. These products include cigarettes, chewing tobacco, and vaping devices, such as e-cigarettes. If you need help quitting, ask your health care provider. Avoid caffeine, alcohol, and certain over-the-counter cold medicines. These may make you feel worse. Ask your pharmacist which medicines to avoid. General instructions Take over-the-counter and prescription medicines only as told by your health care provider. Keep all follow-up visits.  This is important. Where to find support You can get help and support from these sources: Self-help groups. Online and [employer] organizations. A trusted spiritual leader. Couples counseling. Family education classes. Family therapy. Where to find more information You may find that joining a support group helps you deal with your anxiety. The following sources can help you locate counselors or support groups near you: Mental Health America: www.mentalhealthamerica.net Anxiety and Depression Association of [place] (ADAA): ProgramCam.dewww.adaa.org The First Americanational Alliance on Mental Illness (NAMI): www.nami.org Contact a health care provider if: You have a hard time staying focused or finishing daily tasks. You spend many hours a day feeling worried about everyday life. You become exhausted by worry. You start to have headaches or frequently feel tense. You develop chronic nausea or diarrhea. Get help right away if: You have a racing heart and shortness of breath. You have thoughts of hurting yourself or others. If you ever feel like you may hurt yourself or others, or have thoughts about taking your own life, get help right away. Go to your nearest emergency department or: Call your local emergency services (911 in the [place] Call a suicide crisis helpline, such as the National Suicide Prevention Lifeline at 308-836-53561-2893802310 or 988 in the [place] This is open 24 hours a day in the [place] Text the Crisis Text Line at 702-726-8714741741 (in the [place].). Summary Taking steps to learn and use tension reduction techniques can help calm you and help prevent triggering an anxiety reaction. When used together, medicines, psychotherapy, and tension reduction techniques may be the most effective treatment.  Family, friends, and partners can play a big part in supporting you. This information is not intended to replace advice given to you by your health care provider. Make sure you discuss any questions you have with your health care  provider. Document Revised: 05/06/2021 Document Reviewed: 02/01/2021 Elsevier Patient Education  2022 Elsevier Inc.  Generalized Anxiety Disorder, Adult Generalized anxiety disorder (GAD) is a mental health condition. Unlike normal worries, anxiety related to GAD is not triggered by a specific event. These worries do not fade or get better with time. GAD interferes with relationships, work, and school. GAD symptoms can vary from mild to severe. People with severe GAD can have intense waves of anxiety with physical symptoms that are similar to panic attacks. What are the causes? The exact cause of GAD is not known, but the following are believed to have an impact: Differences in natural brain chemicals. Genes passed down from parents to children. Differences in the way threats are perceived. Development and stress during childhood. Personality. What increases the risk? The following factors may make you more likely to develop this condition: Being female. Having a family history of anxiety disorders. Being very shy. Experiencing very stressful life events, such as the death of a loved one. Having a very stressful family environment. What are the signs or symptoms? People with GAD often worry excessively about many things in their lives, such as their health and family. Symptoms may also include: Mental and emotional symptoms: Worrying excessively about natural disasters. Fear of being late. Difficulty concentrating. Fears that others are judging your performance. Physical symptoms: Fatigue. Headaches, muscle tension, muscle twitches, trembling, or feeling shaky. Feeling like your heart is pounding or beating very fast. Feeling out of breath or like you cannot take a deep breath. Having trouble falling asleep or staying asleep, or experiencing restlessness. Sweating. Nausea, diarrhea, or irritable bowel syndrome (IBS). Behavioral symptoms: Experiencing erratic moods or  irritability. Avoidance of new situations. Avoidance of people. Extreme difficulty making decisions. How is this diagnosed? This condition is diagnosed based on your symptoms and medical history. You will also have a physical exam. Your health care provider may perform tests to rule out other possible causes of your symptoms. To be diagnosed with GAD, a person must have anxiety that: Is out of his or her control. Affects several different aspects of his or her life, such as work and relationships. Causes distress that makes him or her unable to take part in normal activities. Includes at least three symptoms of GAD, such as restlessness, fatigue, trouble concentrating, irritability, muscle tension, or sleep problems. Before your health care provider can confirm a diagnosis of GAD, these symptoms must be present more days than they are not, and they must last for 6 months or longer. How is this treated? This condition may be treated with: Medicine. Antidepressant medicine is usually prescribed for long-term daily control. Anti-anxiety medicines may be added in severe cases, especially when panic attacks occur. Talk therapy (psychotherapy). Certain types of talk therapy can be helpful in treating GAD by providing support, education, and guidance. Options include: Cognitive behavioral therapy (CBT). People learn coping skills and self-calming techniques to ease their physical symptoms. They learn to identify unrealistic thoughts and behaviors and to replace them with more appropriate thoughts and behaviors. Acceptance and commitment therapy (ACT). This treatment teaches people how to be mindful as a way to cope with unwanted thoughts and feelings. Biofeedback. This process trains you to manage your body's response (physiological response) through  breathing techniques and relaxation methods. You will work with a therapist while machines are used to monitor your physical symptoms. Stress management  techniques. These include yoga, meditation, and exercise. A mental health specialist can help determine which treatment is best for you. Some people see improvement with one type of therapy. However, other people require a combination of therapies. Follow these instructions at home: Lifestyle Maintain a consistent routine and schedule. Anticipate stressful situations. Create a plan and allow extra time to work with your plan. Practice stress management or self-calming techniques that you have learned from your therapist or your health care provider. Exercise regularly and spend time outdoors. Eat a healthy diet that includes plenty of vegetables, fruits, whole grains, low-fat dairy products, and lean protein. Do not eat a lot of foods that are high in fat, added sugar, or salt (sodium). Drink plenty of water. Avoid alcohol. Alcohol can increase anxiety. Avoid caffeine and certain over-the-counter cold medicines. These may make you feel worse. Ask your pharmacist which medicines to avoid. General instructions Take over-the-counter and prescription medicines only as told by your health care provider. Understand that you are likely to have setbacks. Accept this and be kind to yourself as you persist to take better care of yourself. Anticipate stressful situations. Create a plan and allow extra time to work with your plan. Recognize and accept your accomplishments, even if you judge them as small. Spend time with people who care about you. Keep all follow-up visits. This is important. Where to find more information [employer] of Mental Health: http://www.maynard.net/ Substance Abuse and Mental Health Services: SkateOasis.com.pt Contact a health care provider if: Your symptoms do not get better. Your symptoms get worse. You have signs of depression, such as: A persistently sad or irritable mood. Loss of enjoyment in activities that used to bring you joy. Change in weight or eating. Changes in  sleeping habits. Get help right away if: You have thoughts about hurting yourself or others. If you ever feel like you may hurt yourself or others, or have thoughts about taking your own life, get help right away. Go to your nearest emergency department or: Call your local emergency services (911 in the [place] Call a suicide crisis helpline, such as the National Suicide Prevention Lifeline at 321-267-6542 or 988 in the [place] This is open 24 hours a day in the [place] Text the Crisis Text Line at (254)281-6722 (in the [place] Summary Generalized anxiety disorder (GAD) is a mental health condition that involves worry that is not triggered by a specific event. People with GAD often worry excessively about many things in their lives, such as their health and family. GAD may cause symptoms such as restlessness, trouble concentrating, sleep problems, frequent sweating, nausea, diarrhea, headaches, and trembling or muscle twitching. A mental health specialist can help determine which treatment is best for you. Some people see improvement with one type of therapy. However, other people require a combination of therapies. This information is not intended to replace advice given to you by your health care provider. Make sure you discuss any questions you have with your health care provider. Document Revised: 05/06/2021 Document Reviewed: 02/01/2021 Elsevier Patient Education  2022 [employer].

## 2022-05-05 ENCOUNTER — Encounter: Payer: Managed Care, Other (non HMO) | Admitting: Internal Medicine

## 2022-05-29 ENCOUNTER — Emergency Department
Admission: EM | Admit: 2022-05-29 | Discharge: 2022-05-29 | Disposition: A | Discharge status: Home / Self Care | Payer: Managed Care, Other (non HMO) | Attending: Emergency Medicine | Admitting: Emergency Medicine

## 2022-05-29 ENCOUNTER — Emergency Department: Payer: Managed Care, Other (non HMO)

## 2022-05-29 ENCOUNTER — Other Ambulatory Visit: Payer: Self-pay

## 2022-05-29 ENCOUNTER — Encounter: Payer: Self-pay | Admitting: Emergency Medicine

## 2022-05-29 DIAGNOSIS — R079 Chest pain, unspecified: Secondary | ICD-10-CM

## 2022-05-29 DIAGNOSIS — R0789 Other chest pain: Secondary | ICD-10-CM | POA: Insufficient documentation

## 2022-05-29 DIAGNOSIS — M545 Low back pain, unspecified: Secondary | ICD-10-CM | POA: Diagnosis not present

## 2022-05-29 LAB — CBC
HCT: 41 % (ref 36.0–46.0)
Hemoglobin: 14.1 g/dL (ref 12.0–15.0)
MCH: 30.6 pg (ref 26.0–34.0)
MCHC: 34.4 g/dL (ref 30.0–36.0)
MCV: 88.9 fL (ref 80.0–100.0)
Platelets: 233 10*3/uL (ref 150–400)
RBC: 4.61 MIL/uL (ref 3.87–5.11)
RDW: 11.5 % (ref 11.5–15.5)
WBC: 11.2 10*3/uL — ABNORMAL HIGH (ref 4.0–10.5)
nRBC: 0 % (ref 0.0–0.2)

## 2022-05-29 LAB — BASIC METABOLIC PANEL
Anion gap: 9 (ref 5–15)
BUN: 12 mg/dL (ref 6–20)
CO2: 22 mmol/L (ref 22–32)
Calcium: 9.6 mg/dL (ref 8.9–10.3)
Chloride: 109 mmol/L (ref 98–111)
Creatinine, Ser: 0.85 mg/dL (ref 0.44–1.00)
GFR, Estimated: >60 mL/min (ref >60)
Glucose, Bld: 112 mg/dL — ABNORMAL HIGH (ref 70–99)
Potassium: 3.6 mmol/L (ref 3.5–5.1)
Sodium: 140 mmol/L (ref 135–145)

## 2022-05-29 LAB — TROPONIN I (HIGH SENSITIVITY): Troponin I (High Sensitivity): 3 ng/L (ref <18)

## 2022-05-29 NOTE — ED Triage Notes (Signed)
Patient to ED from [facility] for Chest Pain x3 days. Centralized pain that does not radiate.

## 2022-05-29 NOTE — ED Provider Triage Note (Signed)
Emergency Medicine Provider Triage Evaluation Note  [place] G Neuharth , a 31 y.o. female  was evaluated in triage.  Pt complains of intermittent chest pain. Happens a couple times per hour and lasts for a couple seconds. Described as "tightness."  Denies exogenous hormones/OCPs. Denies travel. No leg swelling. No history of PE/DVT.  Review of Systems  Positive: CP Negative: SOB, N/V, leg swelling  Physical Exam  BP 116/82 (BP Location: Left Arm)   Pulse 86   Temp 98.4 F (36.9 C) (Oral)   Resp 18   Ht 5\' 4"  (1.626 m)   Wt 51.7 kg   SpO2 99%   BMI 19.57 kg/m  Gen:   Awake, no distress   Resp:  Normal effort  MSK:   Moves extremities without difficulty  Other:    Medical Decision Making  Medically screening exam initiated at 4:29 PM.  Appropriate orders placed.  ANNAGRACE CARR was informed that the remainder of the evaluation will be completed by another provider, this initial triage assessment does not replace that evaluation, and the importance of remaining in the ED until their evaluation is complete.     Azucena Cecil, PA-C 05/29/22 1633

## 2022-05-29 NOTE — ED Provider Notes (Signed)
[facility] Provider Note    Event Date/Time   First MD Initiated Contact with Patient 05/29/22 1744     (approximate)   History   Chest Pain   HPI  Brandi Montgomery is a 31 y.o. female with history of kidney stones presents for evaluation of some left-sided chest tightness on and off for last 3 days.  She is currently chest pain-free.  She advised to seek evaluation emergency department from urgent care.  She states that she had left lower back cramp earlier as well but this is all since resolved.  She has no current back pain, chest pain, abdominal pain has not had any associated cough, shortness of breath, fevers, headache or earache, sore throat, nausea, vomiting, diarrhea, burning with urination, blood in her urine or abnormal vaginal bleeding or discharge.  Denies any hormonal birth control use or tobacco use or history of DVT/PE.  She has no other acute concerns at this time.    History reviewed. No pertinent past medical history.   Physical Exam  Triage Vital Signs: ED Triage Vitals [05/29/22 1625]  Enc Vitals Group     BP 116/82     Pulse Rate 86     Resp 18     Temp 98.4 F (36.9 C)     Temp Source Oral     SpO2 99 %     Weight 114 lb (51.7 kg)     Height 5\' 4"  (1.626 m)     Head Circumference      Peak Flow      Pain Score 4     Pain Loc      Pain Edu?      Excl. in GC?     Most recent vital signs: Vitals:   05/29/22 1625  BP: 116/82  Pulse: 86  Resp: 18  Temp: 98.4 F (36.9 C)  SpO2: 99%    General: Awake, no distress.  CV:  Good peripheral perfusion.  Resp:  Normal effort.  Abd:  No distention.  Other:     ED Results / Procedures / Treatments  Labs (all labs ordered are listed, but only abnormal results are displayed) Labs Reviewed  BASIC METABOLIC PANEL - Abnormal; Notable for the following components:      Result Value   Glucose, Bld 112 (*)    All other components within normal limits  CBC - Abnormal;  Notable for the following components:   WBC 11.2 (*)    All other components within normal limits  POC URINE PREG, ED  TROPONIN I (HIGH SENSITIVITY)  TROPONIN I (HIGH SENSITIVITY)     EKG  EKG is remarkable for sinus rhythm with some sinus arrhythmia, ventricular rate 99, apparent Q-wave in septal leads without any other clear evidence of acute ischemia or significant arrhythmia.  Normal axis.   RADIOLOGY Chest reviewed by myself shows no focal consoidation, effusion, edema, pneumothorax or other clear acute thoracic process. I also reviewed radiology interpretation and agree with findings described.    PROCEDURES:  Critical Care performed: No  Procedures    MEDICATIONS ORDERED IN ED: Medications - No data to display   IMPRESSION / MDM / ASSESSMENT AND PLAN / ED COURSE  I reviewed the triage vital signs and the nursing notes. Patient's presentation is most consistent with acute presentation with potential threat to life or bodily function.  Differential diagnosis includes, but is not limited to muscle spasm, costochondritis, esophageal spasm, ACS, pneumonia or pneumothorax.  She is PERC negative and have a low suspicion for PE.  Overall description of symptoms seems less suggestive of GERD and overall have a very low suspicion based on history exam and currently being pain-free for a dissection.  EKG is remarkable for sinus rhythm with some sinus arrhythmia, ventricular rate 99, apparent Q-wave in septal leads without any other clear evidence of acute ischemia or significant arrhythmia.  Normal axis.  Nonelevated troponin obtained greater than 3 hours after symptom onset is not suggestive of ACS  CBC with WC count of 11.2 somewhat nonspecific with normal hemoglobin and platelets.  BMP without significant electrolyte or metabolic derangements.  Chest reviewed by myself shows no focal consoidation, effusion, edema, pneumothorax or other clear  acute thoracic process. I also reviewed radiology interpretation and agree with findings described.  Overall unclear precise etiology for symptoms although suspicion for immediate life-threatening process at this time.  Think patient is appropriate for outpatient follow-up.  Advised use Tylenol ibuprofen as needed.  Discharged in stable condition.  Advised to return for any new or worsening of symptoms.   FINAL CLINICAL IMPRESSION(S) / ED DIAGNOSES   Final diagnoses:  Chest pain, unspecified type     Rx / DC Orders   ED Discharge Orders     None        Note:  This document was prepared using Dragon voice recognition software and may include unintentional dictation errors.   Gilles Chiquito, MD 05/29/22 [phone]

## 2022-06-18 ENCOUNTER — Ambulatory Visit (INDEPENDENT_AMBULATORY_CARE_PROVIDER_SITE_OTHER): Payer: Managed Care, Other (non HMO) | Admitting: Internal Medicine

## 2022-06-18 DIAGNOSIS — R079 Chest pain, unspecified: Secondary | ICD-10-CM

## 2022-06-18 NOTE — Progress Notes (Unsigned)
Pt was no show

## 2023-03-31 ENCOUNTER — Encounter: Payer: Self-pay | Admitting: Nurse Practitioner

## 2023-03-31 ENCOUNTER — Ambulatory Visit: Payer: Managed Care, Other (non HMO) | Admitting: Nurse Practitioner

## 2023-03-31 VITALS — BP 118/70 | HR 74 | Temp 99.0°F | Ht 64.0 in | Wt 139.4 lb

## 2023-03-31 DIAGNOSIS — Z13220 Encounter for screening for lipoid disorders: Secondary | ICD-10-CM | POA: Diagnosis not present

## 2023-03-31 DIAGNOSIS — R61 Generalized hyperhidrosis: Secondary | ICD-10-CM | POA: Diagnosis not present

## 2023-03-31 DIAGNOSIS — F319 Bipolar disorder, unspecified: Secondary | ICD-10-CM

## 2023-03-31 DIAGNOSIS — F419 Anxiety disorder, unspecified: Secondary | ICD-10-CM

## 2023-03-31 DIAGNOSIS — F32A Depression, unspecified: Secondary | ICD-10-CM

## 2023-03-31 DIAGNOSIS — Z0000 Encounter for general adult medical examination without abnormal findings: Secondary | ICD-10-CM | POA: Diagnosis not present

## 2023-03-31 MED ORDER — DRYSOL 20 % EX SOLN: CUTANEOUS | 11 refills | Status: DC

## 2023-03-31 NOTE — Assessment & Plan Note (Signed)
Physical exam complete. Lab work as outlined. Will contact patient with results. Pap- UTD. Advised to discuss mammogram and genetic testing recommendations with Ob-Gyn at upcoming appointment due to increased risk for breast cancer. Flu vaccine not due. Tetanus vaccine- UTD. Declined all COVID vaccines. HIV/Hep C screenings negative. Recommended follow ups with Dentist and Ophthalmology for annual exams. Encouraged to work on [employer] and continue exercising. Return to care in one year, sooner PRN.

## 2023-03-31 NOTE — Assessment & Plan Note (Addendum)
Chronic. Stable on current medication regimen. Continue. PHQ- 2 and GAD- 0 today. Follow up with Psychiatry.

## 2023-03-31 NOTE — Assessment & Plan Note (Addendum)
Chronic issue. Will check TSH today and refer to Endocrinology for additional work up and evaluation. Will send Rx for Drysol for patient to trial on affected areas. She can use foot powders as well to help with plantar sweating.

## 2023-03-31 NOTE — Assessment & Plan Note (Signed)
Chronic. Stable on current medication regimen. Continue. Follow up with Psychiatry.  

## 2023-03-31 NOTE — Progress Notes (Signed)
Bethanie Dicker, NP-C Phone: [phone]  [place] G Scheid is a 32 y.o. female who presents today for transfer of care and annual exam. She is followed by Psychiatry. She is doing well on all of her medications.   She continues to suffer with excessive sweating. This has been an ongoing issue for her for over a year. She reports the excessive sweating occurs on her hands, feet and under her arms. She is having to shower twice daily. She has tried multiple over the counter products without relief. The bottoms of her feet are peeling, she was evaluated by Dermatology yesterday who said it was due to the excessive sweating. She would like to proceed with further work up and evaluation.   Diet: Poor- does not eat regularly, occasionally binge eats, not a lot of meat or vegetables, loves potatoes Exercise: Walking daily to and from son's school to pick him up Pap smear: 01/29/2021 Family history-  Colon cancer: No  Breast cancer: Yes, mother (diagnosed in mid 19s) and maternal grandmother  Ovarian cancer: No Menses: LMP- 03/09/2023, regular cycles, denies problems with heavy bleeding Sexually active: Yes- husband had vasectomy Vaccines-   Flu: Not due  Tetanus: 09/01/2016  COVID19: Never HIV screening: Negative Hep C Screening: Negative Tobacco use: No Alcohol use: Yes, rarely- once a month Illicit Drug use: No Dentist: Yes Ophthalmology: Yes   Social History   Tobacco Use  Smoking Status Never  Smokeless Tobacco Never    Current Outpatient Medications on File Prior to Visit  Medication Sig Dispense Refill   buPROPion ER (WELLBUTRIN SR) 100 MG 12 hr tablet Take 100 mg by mouth 2 (two) times daily.     traZODone (DESYREL) 50 MG tablet Take 50 mg by mouth at bedtime as needed.     VRAYLAR 1.5 MG capsule Take 1.5 mg by mouth.     No current facility-administered medications on file prior to visit.    ROS see history of present illness  Objective  Physical Exam Vitals:    03/31/23 1524  BP: 118/70  Pulse: 74  Temp: 99 F (37.2 C)  SpO2: 99%    BP Readings from Last 3 Encounters:  03/31/23 118/70  05/29/22 116/82  12/24/21 118/86   Wt Readings from Last 3 Encounters:  03/31/23 139 lb 6.4 oz (63.2 kg)  05/29/22 114 lb (51.7 kg)  12/24/21 125 lb 8 oz (56.9 kg)    Physical Exam Constitutional:      General: She is not in acute distress.    Appearance: Normal appearance.  HENT:     Head: Normocephalic.     Right Ear: Tympanic membrane normal.     Left Ear: Tympanic membrane normal.     Nose: Nose normal.     Mouth/Throat:     Mouth: Mucous membranes are moist.     Pharynx: Oropharynx is clear.  Eyes:     Conjunctiva/sclera: Conjunctivae normal.     Pupils: Pupils are equal, round, and reactive to light.  Neck:     Thyroid: No thyromegaly.  Cardiovascular:     Rate and Rhythm: Normal rate and regular rhythm.     Heart sounds: Normal heart sounds.  Pulmonary:     Effort: Pulmonary effort is normal.     Breath sounds: Normal breath sounds.  Abdominal:     General: Abdomen is flat. Bowel sounds are normal.     Palpations: Abdomen is soft. There is no mass.     Tenderness: There is no abdominal  tenderness.  Musculoskeletal:        General: Normal range of motion.  Lymphadenopathy:     Cervical: No cervical adenopathy.  Skin:    General: Skin is warm and dry.     Findings: No rash.  Neurological:     General: No focal deficit present.     Mental Status: She is alert.  Psychiatric:        Mood and Affect: Mood normal.        Behavior: Behavior normal.    Assessment/Plan: Please see individual problem list.  Preventative health care Assessment & Plan: Physical exam complete. Lab work as outlined. Will contact patient with results. Pap- UTD. Advised to discuss mammogram and genetic testing recommendations with Ob-Gyn at upcoming appointment due to increased risk for breast cancer. Flu vaccine not due. Tetanus vaccine- UTD. Declined  all COVID vaccines. HIV/Hep C screenings negative. Recommended follow ups with Dentist and Ophthalmology for annual exams. Encouraged to work on [employer] and continue exercising. Return to care in one year, sooner PRN.   Orders: -     CBC with Differential/Platelet -     Comprehensive metabolic panel  Excessive sweating Assessment & Plan: Chronic issue. Will check TSH today and refer to Endocrinology for additional work up and evaluation. Will send Rx for Drysol for patient to trial on affected areas. She can use foot powders as well to help with plantar sweating.   Orders: -     TSH -     Drysol; Apply to affected areas- palms, armpits and feet once daily at bedtime.  Dispense: 60 mL; Refill: 11  Anxiety and depression Assessment & Plan: Chronic. Stable on current medication regimen. Continue. PHQ- 2 and GAD- 0 today. Follow up with Psychiatry.   Orders: -     VITAMIN D 25 Hydroxy (Vit-D Deficiency, Fractures)  Bipolar 1 disorder (HCC) Assessment & Plan: Chronic. Stable on current medication regimen. Continue. Follow up with Psychiatry.   Lipid screening -     Lipid panel   Return in about 1 year (around 03/30/2024) for Annual Exam, sooner PRN.   Bethanie Dicker, NP-C [facility] - [employer]

## 2023-04-01 LAB — COMPREHENSIVE METABOLIC PANEL
ALT: 13 U/L (ref 0–35)
AST: 15 U/L (ref 0–37)
Albumin: 4.4 g/dL (ref 3.5–5.2)
Alkaline Phosphatase: 53 U/L (ref 39–117)
BUN: 12 mg/dL (ref 6–23)
CO2: 23 mEq/L (ref 19–32)
Calcium: 9.1 mg/dL (ref 8.4–10.5)
Chloride: 102 mEq/L (ref 96–112)
Creatinine, Ser: 0.95 mg/dL (ref 0.40–1.20)
GFR: 79.04 mL/min (ref >60.00)
Glucose, Bld: 87 mg/dL (ref 70–99)
Potassium: 4.1 mEq/L (ref 3.5–5.1)
Sodium: 135 mEq/L (ref 135–145)
Total Bilirubin: 0.3 mg/dL (ref 0.2–1.2)
Total Protein: 7.5 g/dL (ref 6.0–8.3)

## 2023-04-01 LAB — CBC WITH DIFFERENTIAL/PLATELET
Basophils Absolute: 0.1 10*3/uL (ref 0.0–0.1)
Basophils Relative: 0.7 % (ref 0.0–3.0)
Eosinophils Absolute: 0.1 10*3/uL (ref 0.0–0.7)
Eosinophils Relative: 1.2 % (ref 0.0–5.0)
HCT: 42.9 % (ref 36.0–46.0)
Hemoglobin: 14 g/dL (ref 12.0–15.0)
Lymphocytes Relative: 18.4 % (ref 12.0–46.0)
Lymphs Abs: 2 10*3/uL (ref 0.7–4.0)
MCHC: 32.7 g/dL (ref 30.0–36.0)
MCV: 91.4 fl (ref 78.0–100.0)
Monocytes Absolute: 0.7 10*3/uL (ref 0.1–1.0)
Monocytes Relative: 6.6 % (ref 3.0–12.0)
Neutro Abs: 8.1 10*3/uL — ABNORMAL HIGH (ref 1.4–7.7)
Neutrophils Relative %: 73.1 % (ref 43.0–77.0)
Platelets: 227 10*3/uL (ref 150.0–400.0)
RBC: 4.69 Mil/uL (ref 3.87–5.11)
RDW: 12 % (ref 11.5–15.5)
WBC: 11.1 10*3/uL — ABNORMAL HIGH (ref 4.0–10.5)

## 2023-04-01 LAB — LIPID PANEL
Cholesterol: 190 mg/dL (ref 0–200)
HDL: 57.1 mg/dL (ref >39.00)
LDL Cholesterol: 111 mg/dL — ABNORMAL HIGH (ref 0–99)
NonHDL: 132.72
Total CHOL/HDL Ratio: 3
Triglycerides: 110 mg/dL (ref 0.0–149.0)
VLDL: 22 mg/dL (ref 0.0–40.0)

## 2023-04-01 LAB — VITAMIN D 25 HYDROXY (VIT D DEFICIENCY, FRACTURES): VITD: 25.78 ng/mL — ABNORMAL LOW (ref 30.00–100.00)

## 2023-04-01 LAB — TSH: TSH: 1.48 u[IU]/mL (ref 0.35–5.50)

## 2023-04-04 ENCOUNTER — Encounter: Payer: Self-pay | Admitting: Nurse Practitioner

## 2023-04-04 ENCOUNTER — Telehealth: Payer: Self-pay

## 2023-04-04 NOTE — Telephone Encounter (Signed)
Provider would like pt scheduled for a 3 month lab appt:   Bethanie Dicker, NP  Donavan Foil, CMA Lab appointment in 3 months please

## 2023-04-04 NOTE — Telephone Encounter (Signed)
I have called and got pt scheduled for 3 month lab

## 2023-04-18 ENCOUNTER — Encounter: Payer: Self-pay | Admitting: Nurse Practitioner

## 2023-05-06 ENCOUNTER — Encounter: Payer: Self-pay | Admitting: Nurse Practitioner

## 2023-06-29 ENCOUNTER — Other Ambulatory Visit: Payer: Self-pay | Admitting: Nurse Practitioner

## 2023-06-29 ENCOUNTER — Telehealth: Payer: Self-pay | Admitting: Nurse Practitioner

## 2023-06-29 DIAGNOSIS — D72829 Elevated white blood cell count, unspecified: Secondary | ICD-10-CM

## 2023-06-29 NOTE — Telephone Encounter (Signed)
Patient need labs orders

## 2023-07-04 ENCOUNTER — Other Ambulatory Visit (INDEPENDENT_AMBULATORY_CARE_PROVIDER_SITE_OTHER): Payer: Managed Care, Other (non HMO)

## 2023-07-04 DIAGNOSIS — D72829 Elevated white blood cell count, unspecified: Secondary | ICD-10-CM

## 2023-07-04 LAB — CBC WITH DIFFERENTIAL/PLATELET
Basophils Absolute: 0.1 10*3/uL (ref 0.0–0.1)
Basophils Relative: 1.3 % (ref 0.0–3.0)
Eosinophils Absolute: 0.2 10*3/uL (ref 0.0–0.7)
Eosinophils Relative: 2.6 % (ref 0.0–5.0)
HCT: 41.6 % (ref 36.0–46.0)
Hemoglobin: 13.7 g/dL (ref 12.0–15.0)
Lymphocytes Relative: 30.9 % (ref 12.0–46.0)
Lymphs Abs: 2.2 10*3/uL (ref 0.7–4.0)
MCHC: 33 g/dL (ref 30.0–36.0)
MCV: 90.5 fl (ref 78.0–100.0)
Monocytes Absolute: 0.5 10*3/uL (ref 0.1–1.0)
Monocytes Relative: 7.6 % (ref 3.0–12.0)
Neutro Abs: 4.1 10*3/uL (ref 1.4–7.7)
Neutrophils Relative %: 57.6 % (ref 43.0–77.0)
Platelets: 210 10*3/uL (ref 150.0–400.0)
RBC: 4.6 Mil/uL (ref 3.87–5.11)
RDW: 12.6 % (ref 11.5–15.5)
WBC: 7.1 10*3/uL (ref 4.0–10.5)

## 2023-07-20 ENCOUNTER — Ambulatory Visit
Admission: RE | Admit: 2023-07-20 | Discharge: 2023-07-20 | Disposition: A | Discharge status: Home / Self Care | Payer: Managed Care, Other (non HMO) | Source: Ambulatory Visit | Attending: Nurse Practitioner | Admitting: *Deleted

## 2023-07-20 ENCOUNTER — Ambulatory Visit: Payer: Managed Care, Other (non HMO) | Admitting: Nurse Practitioner

## 2023-07-20 ENCOUNTER — Encounter: Payer: Self-pay | Admitting: Nurse Practitioner

## 2023-07-20 ENCOUNTER — Ambulatory Visit
Admission: RE | Admit: 2023-07-20 | Discharge: 2023-07-20 | Disposition: A | Discharge status: Home / Self Care | Payer: Managed Care, Other (non HMO) | Attending: Nurse Practitioner | Admitting: Nurse Practitioner

## 2023-07-20 VITALS — BP 98/62 | HR 55 | Temp 98.1°F | Ht 64.0 in | Wt 140.4 lb

## 2023-07-20 DIAGNOSIS — K219 Gastro-esophageal reflux disease without esophagitis: Secondary | ICD-10-CM | POA: Diagnosis not present

## 2023-07-20 DIAGNOSIS — M25472 Effusion, left ankle: Secondary | ICD-10-CM | POA: Insufficient documentation

## 2023-07-20 NOTE — Progress Notes (Signed)
Bethanie Dicker, NP-C Phone: [phone]  Brandi Montgomery is a 32 y.o. female who presents today for ankle swelling.   Patient with left ankle swelling in the evenings for the past several weeks. Patient notes a history of ankle surgery on her left ankle when she was younger after suffering an injury. She has now noticed that her ankle gets "puffy" by the end of the day. She does not have any swelling currently. It is not painful. There is no warmth or redness to the ankle. It usually resolves with elevation.   GERD:  Worsening symptoms  Reflux symptoms: bloating, nausea, heartburn, bitter taste in mouth   Abd pain: Epigastric   Blood in stool: No  Dysphagia: No   EGD: No  Medication: None   Social History   Tobacco Use  Smoking Status Never  Smokeless Tobacco Never    Current Outpatient Medications on File Prior to Visit  Medication Sig Dispense Refill   aluminum chloride (DRYSOL) 20 % external solution Apply to affected areas- palms, armpits and feet once daily at bedtime. 60 mL 11   buPROPion ER (WELLBUTRIN SR) 100 MG 12 hr tablet Take 100 mg by mouth 2 (two) times daily.     traZODone (DESYREL) 50 MG tablet Take 50 mg by mouth at bedtime as needed.     No current facility-administered medications on file prior to visit.     ROS see history of present illness  Objective  Physical Exam Vitals:   07/20/23 0809  BP: 98/62  Pulse: (!) 55  Temp: 98.1 F (36.7 C)  SpO2: 97%    BP Readings from Last 3 Encounters:  07/20/23 98/62  03/31/23 118/70  05/29/22 116/82   Wt Readings from Last 3 Encounters:  07/20/23 140 lb 6.4 oz (63.7 kg)  03/31/23 139 lb 6.4 oz (63.2 kg)  05/29/22 114 lb (51.7 kg)    Physical Exam Constitutional:      General: She is not in acute distress.    Appearance: Normal appearance.  HENT:     Head: Normocephalic.  Cardiovascular:     Rate and Rhythm: Normal rate and regular rhythm.     Heart sounds: Normal heart sounds.  Pulmonary:      Effort: Pulmonary effort is normal.     Breath sounds: Normal breath sounds.  Musculoskeletal:     Right ankle: Normal.     Left ankle: Normal. No swelling. No tenderness. Normal range of motion.  Skin:    General: Skin is warm and dry.  Neurological:     General: No focal deficit present.     Mental Status: She is alert.  Psychiatric:        Mood and Affect: Mood normal.        Behavior: Behavior normal.    Assessment/Plan: Please see individual problem list.  Ankle swelling, left Assessment & Plan: No swelling noted today on exam. Mostly occurs in evenings. Patient denies ankle pain. Politely declined referral to Ortho at this time. Will get x-ray for further evaluation. Advised Ibuprofen as needed and to rest, ice and elevate her ankle. Further work up pending x-ray results. She will contact if worsening or changing.   Orders: -     DG Ankle Complete Left; Future  Gastroesophageal reflux disease, unspecified whether esophagitis present Assessment & Plan: Chronic issue. Worsening symptoms. Not on any medications. Will start patient on Omeprazole 20 mg daily. Encouraged to monitor diet for triggers such as decreasing caffeine and alcohol intake and  avoiding spicy and acidic foods. Dietary information provided to patient. Will continue to monitor.   Orders: -     Omeprazole; Take 1 capsule (20 mg total) by mouth daily.  Dispense: 90 capsule; Refill: 3   Return if symptoms worsen or fail to improve.   Bethanie Dicker, NP-C [facility] - [employer]

## 2023-07-24 ENCOUNTER — Encounter: Payer: Self-pay | Admitting: Nurse Practitioner

## 2023-07-25 MED ORDER — OMEPRAZOLE 20 MG PO CPDR: 20.0000 mg | DELAYED_RELEASE_CAPSULE | Freq: Every day | ORAL | 3 refills | Status: DC

## 2023-07-25 NOTE — Assessment & Plan Note (Signed)
Chronic issue. Worsening symptoms. Not on any medications. Will start patient on Omeprazole 20 mg daily. Encouraged to monitor diet for triggers such as decreasing caffeine and alcohol intake and avoiding spicy and acidic foods. Dietary information provided to patient. Will continue to monitor.

## 2023-07-25 NOTE — Assessment & Plan Note (Signed)
No swelling noted today on exam. Mostly occurs in evenings. Patient denies ankle pain. Politely declined referral to Ortho at this time. Will get x-ray for further evaluation. Advised Ibuprofen as needed and to rest, ice and elevate her ankle. Further work up pending x-ray results. She will contact if worsening or changing.

## 2023-11-17 NOTE — Telephone Encounter (Signed)
Copied from CRM [id number]. Topic: Appointments - Transfer of Care >> Nov 17, 2023  3:20 PM Chantha C wrote: Pt is requesting to transfer FROM: NP, Bethanie Dicker Pt is requesting to transfer TO: Dr. Roxy Manns Reason for requested transfer: Location is closer to home, please contact [phone] It is the responsibility of the team the patient would like to transfer to (Dr. Milinda Antis) to reach out to the patient if for any reason this transfer is not acceptable.

## 2023-11-20 NOTE — Telephone Encounter (Signed)
She would need to wait for a new patient appointment - unsure how far out that is  That is ok

## 2023-11-21 NOTE — Telephone Encounter (Signed)
Patient has been scheduled

## 2024-03-20 ENCOUNTER — Emergency Department

## 2024-03-20 ENCOUNTER — Emergency Department
Admission: EM | Admit: 2024-03-20 | Discharge: 2024-03-20 | Disposition: A | Discharge status: Home / Self Care | Attending: Emergency Medicine | Admitting: Emergency Medicine

## 2024-03-20 ENCOUNTER — Other Ambulatory Visit: Payer: Self-pay

## 2024-03-20 DIAGNOSIS — N23 Unspecified renal colic: Secondary | ICD-10-CM | POA: Insufficient documentation

## 2024-03-20 DIAGNOSIS — D72829 Elevated white blood cell count, unspecified: Secondary | ICD-10-CM | POA: Insufficient documentation

## 2024-03-20 DIAGNOSIS — R109 Unspecified abdominal pain: Secondary | ICD-10-CM | POA: Diagnosis present

## 2024-03-20 LAB — URINALYSIS, ROUTINE W REFLEX MICROSCOPIC
Bilirubin Urine: NEGATIVE
Glucose, UA: NEGATIVE mg/dL
Ketones, ur: 5 mg/dL — AB
Leukocytes,Ua: NEGATIVE
Nitrite: NEGATIVE
Protein, ur: 100 mg/dL — AB
RBC / HPF: >50 RBC/hpf (ref 0–5)
Specific Gravity, Urine: 1.021 (ref 1.005–1.030)
pH: 5 (ref 5.0–8.0)

## 2024-03-20 LAB — CBC WITH DIFFERENTIAL/PLATELET
Abs Immature Granulocytes: 0.04 10*3/uL (ref 0.00–0.07)
Basophils Absolute: 0.1 10*3/uL (ref 0.0–0.1)
Basophils Relative: 1 %
Eosinophils Absolute: 0.2 10*3/uL (ref 0.0–0.5)
Eosinophils Relative: 1 %
HCT: 37.7 % (ref 36.0–46.0)
Hemoglobin: 13 g/dL (ref 12.0–15.0)
Immature Granulocytes: 0 %
Lymphocytes Relative: 15 %
Lymphs Abs: 2.2 10*3/uL (ref 0.7–4.0)
MCH: 30.8 pg (ref 26.0–34.0)
MCHC: 34.5 g/dL (ref 30.0–36.0)
MCV: 89.3 fL (ref 80.0–100.0)
Monocytes Absolute: 0.8 10*3/uL (ref 0.1–1.0)
Monocytes Relative: 6 %
Neutro Abs: 11.6 10*3/uL — ABNORMAL HIGH (ref 1.7–7.7)
Neutrophils Relative %: 77 %
Platelets: 221 10*3/uL (ref 150–400)
RBC: 4.22 MIL/uL (ref 3.87–5.11)
RDW: 11.8 % (ref 11.5–15.5)
WBC: 15 10*3/uL — ABNORMAL HIGH (ref 4.0–10.5)
nRBC: 0 % (ref 0.0–0.2)

## 2024-03-20 LAB — BASIC METABOLIC PANEL WITH GFR
Anion gap: 11 (ref 5–15)
BUN: 19 mg/dL (ref 6–20)
CO2: 21 mmol/L — ABNORMAL LOW (ref 22–32)
Calcium: 9.1 mg/dL (ref 8.9–10.3)
Chloride: 106 mmol/L (ref 98–111)
Creatinine, Ser: 0.78 mg/dL (ref 0.44–1.00)
GFR, Estimated: >60 mL/min (ref >60)
Glucose, Bld: 126 mg/dL — ABNORMAL HIGH (ref 70–99)
Potassium: 3.9 mmol/L (ref 3.5–5.1)
Sodium: 138 mmol/L (ref 135–145)

## 2024-03-20 LAB — PREGNANCY, URINE: Preg Test, Ur: NEGATIVE

## 2024-03-20 MED ORDER — SODIUM CHLORIDE 0.9 % IV SOLN
1.5000 mg/kg | Freq: Once | INTRAVENOUS | Status: AC
  Administered 2024-03-20: 86 mg via INTRAVENOUS
  Filled 2024-03-20: qty 4.3

## 2024-03-20 MED ORDER — ONDANSETRON 4 MG PO TBDP: ORAL_TABLET | ORAL | 0 refills | Status: DC

## 2024-03-20 MED ORDER — MORPHINE SULFATE (PF) 4 MG/ML IV SOLN
4.0000 mg | Freq: Once | INTRAVENOUS | Status: AC
  Administered 2024-03-20: 4 mg via INTRAVENOUS
  Filled 2024-03-20: qty 1

## 2024-03-20 MED ORDER — ONDANSETRON HCL 4 MG/2ML IJ SOLN
4.0000 mg | INTRAMUSCULAR | Status: AC
  Administered 2024-03-20: 4 mg via INTRAVENOUS
  Filled 2024-03-20: qty 2

## 2024-03-20 MED ORDER — KETOROLAC TROMETHAMINE 30 MG/ML IJ SOLN
15.0000 mg | Freq: Once | INTRAMUSCULAR | Status: AC
  Administered 2024-03-20: 15 mg via INTRAVENOUS
  Filled 2024-03-20: qty 1

## 2024-03-20 MED ORDER — TAMSULOSIN HCL 0.4 MG PO CAPS: ORAL_CAPSULE | ORAL | 0 refills | Status: DC

## 2024-03-20 MED ORDER — OXYCODONE-ACETAMINOPHEN 5-325 MG PO TABS: 2.0000 | ORAL_TABLET | Freq: Four times a day (QID) | ORAL | 0 refills | Status: DC | PRN

## 2024-03-20 MED ORDER — DOCUSATE SODIUM 100 MG PO CAPS: ORAL_CAPSULE | ORAL | 0 refills | Status: DC

## 2024-03-20 NOTE — ED Provider Notes (Signed)
 [facility] Provider Note    Event Date/Time   First MD Initiated Contact with Patient 03/20/24 [phone]     (approximate)   History   Flank Pain   HPI Brandi Montgomery is a 33 y.o. female who presents for evaluation of acute onset left-sided flank pain that occurred a couple of hours ago.  She has had kidney stones twice in the past and this feels similar.  Pain is sharp and nothing in particular makes it better or worse.  Shortly after it started she had nausea and a couple of episodes of vomiting.  No recent fever and no dysuria.  No recent traumatic injury.  No chest pain or shortness of breath.     Physical Exam   Triage Vital Signs: ED Triage Vitals [03/20/24 0238]  Encounter Vitals Group     BP (!) 135/94     Systolic BP Percentile      Diastolic BP Percentile      Pulse Rate (!) 57     Resp 20     Temp 98.4 F (36.9 C)     Temp Source Oral     SpO2 100 %     Weight 56.7 kg (125 lb)     Height 1.626 m (5\' 4" )     Head Circumference      Peak Flow      Pain Score 10     Pain Loc      Pain Education      Exclude from Growth Chart     Most recent vital signs: Vitals:   03/20/24 0550 03/20/24 0600  BP: 114/73 117/74  Pulse: 68 72  Resp: 17 19  Temp:    SpO2: 98% 98%    General: Awake, no distress.  Patient feels much better after medications. CV:  Good peripheral perfusion.  Regular rate and rhythm. Resp:  Normal effort. Speaking easily and comfortably, no accessory muscle usage nor intercostal retractions.   Abd:  No distention.  No tenderness to palpation of the abdomen. Other:  Substantial left flank tenderness to percussion.   ED Results / Procedures / Treatments   Labs (all labs ordered are listed, but only abnormal results are displayed) Labs Reviewed  URINALYSIS, ROUTINE W REFLEX MICROSCOPIC - Abnormal; Notable for the following components:      Result Value   Color, Urine YELLOW (*)    APPearance CLOUDY (*)    Hgb  urine dipstick LARGE (*)    Ketones, ur 5 (*)    Protein, ur 100 (*)    Bacteria, UA FEW (*)    All other components within normal limits  BASIC METABOLIC PANEL WITH GFR - Abnormal; Notable for the following components:   CO2 21 (*)    Glucose, Bld 126 (*)    All other components within normal limits  CBC WITH DIFFERENTIAL/PLATELET - Abnormal; Notable for the following components:   WBC 15.0 (*)    Neutro Abs 11.6 (*)    All other components within normal limits  URINE CULTURE  PREGNANCY, URINE  POC URINE PREG, ED     RADIOLOGY See ED course for details   PROCEDURES:  Critical Care performed: No  Procedures    IMPRESSION / MDM / ASSESSMENT AND PLAN / ED COURSE  I reviewed the triage vital signs and the nursing notes.  Differential diagnosis includes, but is not limited to, ureteral stone, UTI/pyelonephritis, musculoskeletal strain.  Less likely PE.  Patient's presentation is most consistent with acute presentation with potential threat to life or bodily function.  Labs/studies ordered: CBC with differential, CMP, urinalysis, urine pregnancy test, urine culture, 1 view abdominal x-ray  Interventions/Medications given:  Medications  ketorolac  (TORADOL ) 30 MG/ML injection 15 mg (15 mg Intravenous Given 03/20/24 0313)  morphine  (PF) 4 MG/ML injection 4 mg (4 mg Intravenous Given 03/20/24 0314)  ondansetron  (ZOFRAN ) injection 4 mg (4 mg Intravenous Given 03/20/24 0314)  morphine  (PF) 4 MG/ML injection 4 mg (4 mg Intravenous Given 03/20/24 0534)  lidocaine  (XYLOCAINE ) 86 mg in sodium chloride  0.9 % 100 mL IVPB (0 mg Intravenous Stopped 03/20/24 0618)    (Note:  hospital course my include additional interventions and/or labs/studies not listed above.)   Patient is in significant pain upon arrival with history of kidney stones.  I ordered morphine  4 mg IV, Zofran  4 mg IV, and Toradol  15 mg IV while I was caring for another patient.  When I  assessed the patient she said that she feels much better than she did before.  She still has reproducible left flank tenderness to percussion but no pain at rest.  She has no signs or symptoms of infection/sepsis.  Urinalysis is pending.  She has a leukocytosis of 15 but this may be the result of the pain and vomiting.  Metabolic panel is normal.  I discussed CT imaging with her but given that she has a history of stones, and the symptoms are so consistent with a recurrence of ureteral stone, we will hold off on CT scan in favor of an abdominal x-ray to see if we can localize a stone.  She agrees with the plan.     Clinical Course as of 03/20/24 0658  Tue Mar 20, 2024  0447 Urinalysis, Routine w reflex microscopic -Urine, Clean Catch(!) Urinalysis definitely positive for blood, but infection cannot be definitively ruled out based on the presence of few bacteria and WBCs.  However, leukocytes are negative and the patient has nitrite negative urine.  Results are equivocal and I ordered a urine culture.  Abdominal x-ray pending. [CF]  0533 asdf [CF]  0547 DG Abdomen 1 View I independently viewed and interpreted the patient's abdominal x-ray.  She has a few punctate lesions around the left kidney, confirmed by radiology.  Reassessed patient.  Her pain has come back to be quite severe.  I ordered another 4 mg of morphine .  After discussing the probability of renal colic, I also ordered lidocaine  1.5 mg/kg IV as per renal colic treatment protocol.  Cardiac monitoring initiated as well.  I again discussed the high probability of renal/ureteral colic with the patient and again offered a CT scan, but the patient agrees it is not necessary unless I feel it is necessary medically, which I do not.  I confirmed again that the patient has not been having any symptoms of dysuria or foul-smelling urine prior to the onset of the pain and I think that infection or infected stone is very unlikely, particularly given no  other sign of systemic illness.  The plan is to control her pain and I anticipate discharge with outpatient urology follow-up.  She agrees with the plan.  [CF]  [phone] Reassessed patient.  She received the lidocaine  and her pain is gone.  She feels ready for discharge and I think that is reasonable and appropriate.  No concerning prescribing patterns  on [place]  controlled substance database.  I gave my usual and customary follow-up recommendations and return precautions. [CF]    Clinical Course User Index [CF] Lynnda Sas, MD     FINAL CLINICAL IMPRESSION(S) / ED DIAGNOSES   Final diagnoses:  Renal colic on left side     Rx / DC Orders   ED Discharge Orders          Ordered    oxyCODONE -acetaminophen  (PERCOCET) 5-325 MG tablet  Every 6 hours PRN        03/20/24 0658    ondansetron  (ZOFRAN -ODT) 4 MG disintegrating tablet        03/20/24 0658    docusate sodium  (COLACE) 100 MG capsule        03/20/24 0658    tamsulosin (FLOMAX) 0.4 MG CAPS capsule        03/20/24 1610             Note:  This document was prepared using Dragon voice recognition software and may include unintentional dictation errors.   Lynnda Sas, MD 03/20/24 [phone]

## 2024-03-20 NOTE — Discharge Instructions (Signed)
You have been seen in the Emergency Department (ED) today for pain caused by kidney stones.  As we have discussed, please drink plenty of fluids.  Please make a follow up appointment with the physician(s) listed elsewhere in this documentation.  You may take pain medication as needed but ONLY as prescribed.  Please also take your prescribed Flomax daily.  If you are going to follow up with Urology for possible lithotripsy, please do not take any ibuprofen, naproxen, aspirin, Toradol, or other NSAID after Tuesday morning, as this may exclude you from lithotripsy.  Please see your doctor as soon as possible as stones may take 1-3 weeks to pass and you may require additional care or medications.  Do not drink alcohol, drive or participate in any other potentially dangerous activities while taking opiate pain medication as it may make you sleepy. Do not take this medication with any other sedating medications, either prescription or over-the-counter. If you were prescribed Percocet or Vicodin, do not take these with acetaminophen (Tylenol) as it is already contained within these medications.   Take Percocet as needed for severe pain.  This medication is an opiate (or narcotic) pain medication and can be habit forming.  Use it as little as possible to achieve adequate pain control.  Do not use or use it with extreme caution if you have a history of opiate abuse or dependence.  If you are on a pain contract with your primary care doctor or a pain specialist, be sure to let them know you were prescribed this medication today from the [facility].  This medication is intended for your use only - do not give any to anyone else and keep it in a secure place where nobody else, especially children, have access to it.  It will also cause or worsen constipation, so you may want to consider taking an over-the-counter stool softener while you are taking this medication.  Return to the Emergency  Department (ED) or call your doctor if you have any worsening pain, fever, painful urination, are unable to urinate, or develop other symptoms that concern you.  

## 2024-03-20 NOTE — ED Triage Notes (Addendum)
 Pt presents c/o left sided flank pain since last night. Reports has hx of kidney stones

## 2024-03-21 LAB — URINE CULTURE: Culture: 50000 — AB

## 2024-03-26 ENCOUNTER — Encounter: Payer: Self-pay | Admitting: Family Medicine

## 2024-03-26 ENCOUNTER — Ambulatory Visit: Payer: Managed Care, Other (non HMO) | Admitting: Family Medicine

## 2024-03-26 VITALS — BP 106/68 | HR 66 | Temp 98.4°F | Ht 63.75 in | Wt 124.4 lb

## 2024-03-26 DIAGNOSIS — R61 Generalized hyperhidrosis: Secondary | ICD-10-CM

## 2024-03-26 DIAGNOSIS — Z803 Family history of malignant neoplasm of breast: Secondary | ICD-10-CM

## 2024-03-26 DIAGNOSIS — F319 Bipolar disorder, unspecified: Secondary | ICD-10-CM | POA: Diagnosis not present

## 2024-03-26 DIAGNOSIS — E559 Vitamin D deficiency, unspecified: Secondary | ICD-10-CM | POA: Diagnosis not present

## 2024-03-26 DIAGNOSIS — M25472 Effusion, left ankle: Secondary | ICD-10-CM

## 2024-03-26 DIAGNOSIS — F419 Anxiety disorder, unspecified: Secondary | ICD-10-CM | POA: Diagnosis not present

## 2024-03-26 DIAGNOSIS — N200 Calculus of kidney: Secondary | ICD-10-CM

## 2024-03-26 NOTE — Assessment & Plan Note (Signed)
 See prevention a/p from prior pcp Last labs last summer Drysol caused hives  Was referred to endo at the time- per pt could not get a call back  Will re place a referral  Pt prefers to not repeat blood work if possible

## 2024-03-26 NOTE — Progress Notes (Signed)
 Subjective:    Patient ID: Brandi Montgomery, female    DOB: Jun 25, 1991, 33 y.o.   MRN: [id number]  HPI  Wt Readings from Last 3 Encounters:  03/26/24 124 lb 6 oz (56.4 kg)  03/20/24 125 lb (56.7 kg)  07/20/23 140 lb 6.4 oz (63.7 kg)   21.52 kg/m  Vitals:   03/26/24 0818  BP: 106/68  Pulse: 66  Temp: 98.4 F (36.9 C)  SpO2: 98%   Pt presents to establish for primary care  Previously saw Bluford Burkitt NP   Had recent ER visit for renal colic on left side  History of kidney stones  Lab Results  Component Value Date   NA 138 03/20/2024   K 3.9 03/20/2024   CO2 21 (L) 03/20/2024   GLUCOSE 126 (H) 03/20/2024   BUN 19 03/20/2024   CREATININE 0.78 03/20/2024   CALCIUM 9.1 03/20/2024   GFR 79.04 03/31/2023   GFRNONAA >60 03/20/2024   Lab Results  Component Value Date   WBC 15.0 (H) 03/20/2024   HGB 13.0 03/20/2024   HCT 37.7 03/20/2024   MCV 89.3 03/20/2024   PLT 221 03/20/2024   On xray there were suspected stones seen over lower pole of left kidney  Urine culture showed contamination   History of excessive sweating  Was ref to endocrinology Drysol caused hives   Last thyroid test was a year ago  Always normal  Lab Results  Component Value Date   TSH 1.48 03/31/2023      Mood  Per chart history of anxiety and depression  Also bipolar 1 disorder (3-4 years ago dx)   Goes from over sleeping to sleeping 3-4 hours per night  More down now than up      Stopped all of her medicines -in oct  Celexa 20 Wellbutrin  100 mg bid  Abilify 5 mg Trazodone for sleep   No side effects - just did not work  Doing fair   Wants to start the process of looking for new psychiatrist  Wants to see someone new   Has not seen a counselor in the past   No hosp  No SI or attempt in the past   Lives in [place] but can go to [place] if she needs to          03/26/2024    8:21 AM 03/31/2023    3:27 PM 12/24/2021    4:52 PM 11/10/2021    2:02 PM 02/05/2021     9:01 AM  Depression screen PHQ 2/9  Decreased Interest 1 1 1  0 2  Down, Depressed, Hopeless 1 0 1 0 2  PHQ - 2 Score 2 1 2  0 4  Altered sleeping 1 0 2  2  Tired, decreased energy 1 1 2  2   Change in appetite 1 0 0  0  Feeling bad or failure about yourself  1 0 0  2  Trouble concentrating 1 0 0  3  Moving slowly or fidgety/restless 1 0 0  3  Suicidal thoughts 0 0 0  0  PHQ-9 Score 8 2 6  16   Difficult doing work/chores  Somewhat difficult Somewhat difficult  Extremely dIfficult      03/26/2024    8:22 AM 03/31/2023    3:27 PM 12/24/2021    4:53 PM 02/05/2021    9:02 AM  GAD 7 : Generalized Anxiety Score  Nervous, Anxious, on Edge 1 0 3 3  Control/stop worrying 1 0 3 3  Worry too much - different things 1 0 3 3  Trouble relaxing 1 0 3 3  Restless 1 0 3 3  Easily annoyed or irritable 1 0 3 3  Afraid - awful might happen 1 0 3 0  Total GAD 7 Score 7 0 21 18  Anxiety Difficulty  Not difficult at all Very difficult Extremely difficult       Fam history Mother had stroke at 62 (from complication of diet pills) , and HTN, also breast cancer  Mother had it at 55 ?  MGM breast cancer around 70?   Sees Dr Cathleen Coach for gyn  It is time to follow up         Patient Active Problem List   Diagnosis Date Noted   Vitamin D  deficiency 03/26/2024   Ankle swelling, left 07/20/2023   Preventative health care 03/31/2023   FH: breast cancer 12/24/2021   Anxiety and depression 12/24/2021   Bipolar 1 disorder (HCC) 11/11/2021   Excessive sweating 11/11/2021   Gastroesophageal reflux disease 11/11/2021   History of cholecystectomy 11/10/2021   Kidney stones 11/10/2021   Biliary colic 11/12/2018   History reviewed. No pertinent past medical history. Past Surgical History:  Procedure Laterality Date   ANKLE SURGERY Left 2005   CHOLECYSTECTOMY N/A 11/13/2018   Procedure: LAPAROSCOPIC CHOLECYSTECTOMY;  Surgeon: Conrado Delay, DO;  Location: [facility] ORS;  Service: General;  Laterality:  N/A;   Social History   Tobacco Use   Smoking status: Never   Smokeless tobacco: Never  Vaping Use   Vaping status: Never Used  Substance Use Topics   Alcohol use: Yes    Alcohol/week: 0.0 standard drinks of alcohol    Comment: wine, beer, liquor occassionally   Drug use: No   Family History  Problem Relation Age of Onset   Cancer Mother        breast cancer   Stroke Mother 27       Blood clot related to diet pills.   Hypertension Mother    Cancer Maternal Grandmother 80       Breast cancer   Diabetes Maternal Grandmother    Hypertension Maternal Grandmother    Allergies  Allergen Reactions   Drysol [Aluminum Chloride] Hives   No current outpatient medications on file prior to visit.   No current facility-administered medications on file prior to visit.    Review of Systems  Constitutional:  Positive for fatigue. Negative for activity change, appetite change, fever and unexpected weight change.  HENT:  Negative for congestion, ear pain, rhinorrhea, sinus pressure and sore throat.   Eyes:  Negative for pain, redness and visual disturbance.  Respiratory:  Negative for cough, shortness of breath and wheezing.   Cardiovascular:  Negative for chest pain and palpitations.  Gastrointestinal:  Negative for abdominal pain, blood in stool, constipation and diarrhea.  Endocrine: Positive for heat intolerance. Negative for polydipsia and polyuria.       Excessive sweating all the time   Genitourinary:  Negative for dysuria, frequency and urgency.  Musculoskeletal:  Negative for arthralgias, back pain and myalgias.  Skin:  Negative for pallor and rash.  Allergic/Immunologic: Negative for environmental allergies.  Neurological:  Negative for dizziness, syncope and headaches.  Hematological:  Negative for adenopathy. Does not bruise/bleed easily.  Psychiatric/Behavioral:  Positive for dysphoric mood and sleep disturbance. Negative for decreased concentration. The patient is  nervous/anxious.        Objective:   Physical Exam Constitutional:  General: She is not in acute distress.    Appearance: Normal appearance. She is well-developed and normal weight. She is not ill-appearing or diaphoretic.  HENT:     Head: Normocephalic and atraumatic.  Eyes:     Conjunctiva/sclera: Conjunctivae normal.     Pupils: Pupils are equal, round, and reactive to light.  Neck:     Thyroid: No thyromegaly.     Vascular: No carotid bruit or JVD.  Cardiovascular:     Rate and Rhythm: Normal rate and regular rhythm.     Heart sounds: Normal heart sounds.     No gallop.  Pulmonary:     Effort: Pulmonary effort is normal. No respiratory distress.     Breath sounds: Normal breath sounds. No wheezing or rales.  Abdominal:     General: There is no distension or abdominal bruit.     Palpations: Abdomen is soft.  Musculoskeletal:     Cervical back: Normal range of motion and neck supple. No tenderness.     Right lower leg: No edema.     Left lower leg: No edema.  Lymphadenopathy:     Cervical: No cervical adenopathy.  Skin:    General: Skin is warm and dry.     Coloration: Skin is not pale.     Findings: No rash.  Neurological:     Mental Status: She is alert.     Coordination: Coordination normal.     Deep Tendon Reflexes: Reflexes are normal and symmetric. Reflexes normal.  Psychiatric:        Attention and Perception: Attention normal.        Mood and Affect: Mood normal.        Speech: Speech normal.        Behavior: Behavior normal.        Cognition and Memory: Cognition and memory normal.     Comments: Candidly discusses symptoms and stressors             Assessment & Plan:   Problem List Items Addressed This Visit       Genitourinary   Kidney stones   History of  Had renal colic in late may Seen in ER Reviewed hospital records, lab results and studies in detail   Some ? Stones over left lower kidney on xray  Feels better now           Other   Vitamin D  deficiency   Encouraged pt to take 2000 international units vitamin D3 daily for bone and overall health      FH: breast cancer - Primary   Mother at ? 72 GM at 58   Will d/w her gyn re : early screening ?      Excessive sweating   See prevention a/p from prior pcp Last labs last summer Drysol caused hives  Was referred to endo at the time- per pt could not get a call back  Will re place a referral  Pt prefers to not repeat blood work if possible       Bipolar 1 disorder (HCC)   See a/p for anx/dep Feels down Off med since oct  Needs new psychiatrist       Anxiety and depression   In setting of bipolar 1 Currently depressed  PHQ 8 Gad7 7 Saw Aileen Householder in past and wants to see someone new Has not been successful in getting anyone to call her back from any offices she called   In past took  [employer]el  Sr 100 mg bid  Celexa 20 mg daily  Abilify 5 mg daily  Trazodone for sleep  No side effects , but nothing worked   Off med since [employer]  Has not had counseling      Ankle swelling, left   Left ankle Old reconstruction  Stays more swollen than other ankle

## 2024-03-26 NOTE — Assessment & Plan Note (Signed)
 Left ankle Old reconstruction  Stays more swollen than other ankle

## 2024-03-26 NOTE — Patient Instructions (Signed)
 Get on vitamin D3 over the counter 2000 international units daily for bone and overall healthy   I put the referral in for psychiatry and also endocrinology  Please let us  know if you don't hear in 1-2 weeks to set that up  Take care of yourself

## 2024-03-26 NOTE — Assessment & Plan Note (Signed)
 See a/p for anx/dep Feels down Off med since oct  Needs new psychiatrist

## 2024-03-26 NOTE — Assessment & Plan Note (Signed)
 Mother at ? 49 GM at 45   Will d/w her gyn re : early screening ?

## 2024-03-26 NOTE — Assessment & Plan Note (Signed)
 In setting of bipolar 1 Currently depressed  PHQ 8 Gad7 7 Saw Aileen Householder in past and wants to see someone new Has not been successful in getting anyone to call her back from any offices she called   In past took  Wellburin Sr 100 mg bid  Celexa 20 mg daily  Abilify 5 mg daily  Trazodone for sleep  No side effects , but nothing worked   Off med since [employer]  Has not had counseling

## 2024-03-26 NOTE — Assessment & Plan Note (Signed)
 Encouraged pt to take 2000 international units vitamin D3 daily for bone and overall health

## 2024-03-26 NOTE — Assessment & Plan Note (Addendum)
 History of  Had renal colic in late may Seen in ER Reviewed hospital records, lab results and studies in detail   Some ? Stones over left lower kidney on xray  Feels better now

## 2024-04-03 ENCOUNTER — Encounter: Payer: Managed Care, Other (non HMO) | Admitting: Nurse Practitioner

## 2024-05-06 ENCOUNTER — Encounter: Payer: Self-pay | Admitting: Family Medicine

## 2024-05-21 ENCOUNTER — Other Ambulatory Visit: Payer: Self-pay | Admitting: Medical Genetics

## 2024-05-26 ENCOUNTER — Other Ambulatory Visit: Payer: Self-pay

## 2024-05-29 ENCOUNTER — Other Ambulatory Visit: Payer: Self-pay

## 2024-07-01 NOTE — Progress Notes (Unsigned)
 Psychiatric Initial Adult Assessment  Patient Identification: Brandi G Gainor MRN:  [id number] Date of Evaluation:  07/02/2024 Referral Source: Tower, Laine LABOR, MD  Assessment:  Brandi Montgomery is a 33 y.o. female with a history of depression, anxiety, and prior documented diagnosis of bipolar 1 disorder who presents in person to [facility] for initial evaluation of depression and anxiety.    The patient presented for an initial evaluation with worsening anxiety and depressive symptoms following a period without psychotropic medications. Notably, the patient became tearful during the session when discussing her experiences with past medications and her frustration regarding their lack of efficacy.We discussed the possibility of trialing Cymbalta in the future, given its efficacy for both anxiety and depressive symptoms. However, due to her hesitancy to initiate a new antidepressant, pharmacologic intervention has been limited to as-needed hydroxyzine  for acute anxiety. The patient appears to be an excellent candidate for psychotherapy, which may be more beneficial than medication management at this stage. She was provided with resources for psychotherapy to consider as part of her treatment plan.  Although the patient carries a prior diagnosis of bipolar I disorder, current screening and her subjective report do not reveal a clear history of manic or hypomanic episodes, nor any prior SSRI-induced mania. At this time, her presentation is most consistent with generalized anxiety disorder and unspecified depression, which will continue to be explored further.  Risk Assessment: A suicide and violence risk assessment was performed as part of this evaluation. There patient is deemed to be at chronic elevated risk for self-harm/suicide given the following factors: history of depression. These risk factors are mitigated by the following factors: lack of active SI/HI, no history of  previous suicide attempts, no history of violence, motivation for treatment, minor children living at home, presence of a significant relationship, and safe housing. The patient is deemed to be at chronic elevated risk for violence given the following factors: N/A. These risk factors are mitigated by the following factors: no known history of violence towards others. There is no acute risk for suicide or violence at this time. The patient was educated about relevant modifiable risk factors including following recommendations for treatment of psychiatric illness and abstaining from substance abuse.  While future psychiatric events cannot be accurately predicted, the patient does not currently require  acute inpatient psychiatric care and does not currently meet [place]  involuntary commitment criteria.    Plan:  # GAD Past medication trials: Zoloft , Lexapro, Prozac, Celexa, Effexor, trazodone, BuSpar, Wellbutrin  Status of problem: new to me Interventions: --Recommend psychotherapy -- Start Atarax  25 mg 3 times daily as needed for acute anxiety   Health Maintenance PCP: Randeen Laine LABOR, MD   Patient was given contact information for behavioral health clinic and was instructed to call 911 for emergencies.  Patient and plan of care will be discussed with the Attending MD, who agrees with the above statement and plan.   Subjective:  Chief Complaint:  Chief Complaint  Patient presents with   Establish Care    History of Present Illness:   The patient presented seeking help for her "busy brain." She reported that about a year ago, she discontinued all of her medications, including Vraylar and Abilify, due to significant weight gain, gum pain, and increased irritability while on these agents. She described an extensive history of prior medication trials, including several SSRIs (Prozac, Lexapro, Zoloft , Celexa). She also tried Wellbutrin , which she initially found somewhat effective, but  later felt her symptoms  plateaued at the maximum dose.  She expressed frustration and concern that further medication management--particularly the addition of another SSRI--would simply repeat the same ineffective pattern she experienced with her previous psychiatrist. She did not feel that Cymbalta would address her symptoms of depression and anxiety. Despite this, she acknowledged that her symptoms had worsened since stopping psychotropic medications and became tearful when discussing this.  When discussing psychotherapy, she expressed hesitancy about engaging, particularly due to concerns about "trauma dumping" and not feeling ready to process these issues in sessions. She was, however, open to receiving resources for therapy at the end of the visit.  The patient emphasized that she wanted to be on medications that she could tolerate.  Psychiatric ROS Depression: Patient reports at least 2 weeks of depressed, low mood, anhedonia, poor sleep, fatigue, and diminished concentration.  Patient notes symptoms of depression are driven by worsening anxiety  Anxiety: The patient reports an ongoing lifetime history of excessive anxiety and worry about a number of events, characterized by restlessness or feeling keyed up or on edge, easily fatigued, difficulty concentrating , irritability, and muscle tension.   Panic attacks:  The patient denies any current or past history of panic attacks  Mania/Hypomania: Negative for any past or current symptoms of expansive energy or mood  PSTD: Negative for any history of trauma  Eating Disorder: Negative screening  IED: Negative screening Psychosis:  The patient denies any history of hallucinations, disorganized thinking, paranoia, or delusions    Review of Systems  All other systems reviewed and are negative.    Past Psychiatric History:  Diagnoses: Bipolar I Disorder (around 2022), anxiety, depression Medication trials: vraylar (stopped due weight  gain), abilify (worsened mood), lexapro (denies), prozac (trialed for about 6 months), zoloft  (trialed for about 6 months), trazodone, effexor (trialed for 6 months), buspar, celexa, wellbutrin  Previous psychiatrist/therapist:  Previously had med management with Dr. Viviane Drone Previous [facility] Denies having any prior follow up with therapist Hospitalizations:Denies, non on chart review Suicide attempts: denies NSSIB Hx: denies Hx of violence towards others: denies Hx of trauma/abuse: denies  Substance Abuse History: Alcohol: no current reported hx of abuse, reprots drinking <1 per month for social reasons  Hx of withdrawal seizures/Dts: denies Tobacco: Denies Cannabis: Denies any current or recent use Other Illicit Substance Use: denies IVDU: denied  Past Medical History PCP: Tower, Laine LABOR, MD  Dx: kidney stones ALL: Drysol Drysol [aluminum chloride]  Head trauma: Denies Seizures: Denies   Family Psychiatric History:  Psychiatric disorders: unknown Suicide hx: deneis Homicide: denies  Social History: Living situation: Lives in [place] with husband and her 2 children (ages 53 and 7) 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  Past Surgical History:  Procedure Laterality Date   ANKLE SURGERY Left 2005   CHOLECYSTECTOMY N/A 11/13/2018   Procedure: LAPAROSCOPIC CHOLECYSTECTOMY;  Surgeon: Tye Millet, DO;  Location: [facility] ORS;  Service: General;  Laterality: N/A;    Family History:  Family History  Problem Relation Age of Onset   Cancer Mother        breast cancer   Stroke Mother 29       Blood clot related to diet pills.   Hypertension Mother    Cancer Maternal Grandmother 26       Breast cancer   Diabetes Maternal Grandmother    Hypertension  Maternal Grandmother     Social History:   Social History   Socioeconomic History   Marital status: Married    Spouse name: Francis   Number of children: 2   Years of education: Not on file   Highest education level: 12th grade  Occupational History   Not on file  Tobacco Use   Smoking status: Never   Smokeless tobacco: Never  Vaping Use   Vaping status: Never Used  Substance and Sexual Activity   Alcohol use: Yes    Alcohol/week: 0.0 standard drinks of alcohol    Comment: wine, beer, liquor occassionally   Drug use: No   Sexual activity: Yes    Partners: Male    Birth control/protection: Surgical    Comment: vasectomy  Other Topics Concern   Not on file  Social History Narrative   Divorced   High school   Works as a Economist         Social Drivers of Health   Financial Resource Strain: Low Risk  (03/25/2024)   Overall Financial Resource Strain (CARDIA)    Difficulty of Paying Living Expenses: Not hard at all  Food Insecurity: No Food Insecurity (03/25/2024)   Hunger Vital Sign    Worried About Running Out of Food in the Last Year: Never true    Ran Out of Food in the Last Year: Never true  Transportation Needs: No Transportation Needs (03/25/2024)   PRAPARE - Administrator, Civil Service (Medical): No    Lack of Transportation (Non-Medical): No  Physical Activity: Insufficiently Active (03/25/2024)   Exercise Vital Sign    Days of Exercise per Week: 1 day    Minutes of Exercise per Session: 20 min  Stress: Stress Concern Present (03/25/2024)   [employer] of Occupational Health - Occupational Stress Questionnaire    Feeling of Stress : Very much  Social Connections: Moderately Isolated (03/25/2024)   Social Connection and Isolation Panel    Frequency of Communication with Friends and Family: Three times a week    Frequency of Social Gatherings with Friends and Family: Once a week    Attends Religious Services: Never    Doctor, general practice or Organizations: No    Attends Engineer, structural: Not on file    Marital Status: Married    Allergies:   Allergies  Allergen Reactions   Drysol [Aluminum Chloride] Hives    Current Medications: Current Outpatient Medications  Medication Sig Dispense Refill   hydrOXYzine  (ATARAX ) 25 MG tablet Take 1 tablet (25 mg total) by mouth 3 (three) times daily as needed. 30 tablet 0   Multiple Vitamin (MULTIVITAMINS PO) Take by mouth.     Nutritional Supplements (VITAMIN D  MAINTENANCE PO) Take by mouth.     No current facility-administered medications for this visit.     Objective: Psychiatric Specialty Exam: General Appearance: Casual, fairly groomed  Eye Contact:  Good    Speech:  Clear, coherent, normal rate, spontaneous  Volume:  Normal   Mood:  see above  Affect: Depressed, tearful  Thought Content: Logical  Suicidal Thoughts: see subjective  Thought Process:  Coherent, goal-directed  Orientation:  A&Ox4   Memory:  Immediate good  Judgment:  Fair   Insight:  Fair  Concentration:  Attention and concentration good   Recall:  Good  Fund of Knowledge: Good  Language: Good, fluent  Psychomotor Activity: Normal  Akathisia:  NA   AIMS (if indicated): NA  Assets:   Manufacturing systems engineer Physical Health Social Support  ADL's:  Intact  Cognition: WNL  Sleep: see above  Appetite: see above    Physical Exam Vitals reviewed.  HENT:     Head: Normocephalic and atraumatic.  Eyes:     Extraocular Movements: Extraocular movements intact.     Conjunctiva/sclera: Conjunctivae normal.  Pulmonary:     Effort: Pulmonary effort is normal. No respiratory distress.  Neurological:     General: No focal deficit present.       Metabolic Disorder Labs: No results found for: HGBA1C, MPG No results found for: PROLACTIN Lab Results  Component Value Date   CHOL 190 03/31/2023   TRIG 110.0 03/31/2023   HDL 57.10 03/31/2023   CHOLHDL 3 03/31/2023   VLDL  22.0 03/31/2023   LDLCALC 111 (H) 03/31/2023   LDLCALC 118 (H) 11/10/2021   Lab Results  Component Value Date   TSH 1.48 03/31/2023    Therapeutic Level Labs: No results found for: LITHIUM No results found for: CBMZ No results found for: VALPROATE   Marlo Masson, MD 9/8/20252:05 PM

## 2024-07-02 ENCOUNTER — Ambulatory Visit (HOSPITAL_COMMUNITY): Payer: Self-pay | Admitting: Student in an Organized Health Care Education/Training Program

## 2024-07-02 ENCOUNTER — Encounter (HOSPITAL_COMMUNITY): Payer: Self-pay | Admitting: Student in an Organized Health Care Education/Training Program

## 2024-07-02 ENCOUNTER — Encounter: Payer: Self-pay | Admitting: *Deleted

## 2024-07-02 VITALS — BP 127/80 | HR 80 | Ht 64.0 in | Wt 119.2 lb

## 2024-07-02 DIAGNOSIS — F411 Generalized anxiety disorder: Secondary | ICD-10-CM

## 2024-07-02 MED ORDER — HYDROXYZINE HCL 25 MG PO TABS: 25.0000 mg | ORAL_TABLET | Freq: Three times a day (TID) | ORAL | 0 refills | Status: DC | PRN

## 2024-07-02 NOTE — Patient Instructions (Signed)
 Learning about therapy: The term therapy can mean many things. There are many different types of psychotherapy, some are short-term, others take a longer period of time. A good therapist will meet with you, discuss your goals, and help you set a treatment plan that addresses your major concerns, regardless of what type of therapy they practice. If the first therapist is not a great fit, try another one.   The American Psychological Association (the APA) has resources to learn about different kinds of therapy.  Here is a link to learn more about the different kinds of psychotherapy:   [place] ___ Outpatient Therapy and Psychiatry Resources for Patients: Your psychiatric needs would be well-served by consultation and regular meetings with an outpatient therapist to assist you with your mood-related conditions. Here are a series of links for finding a therapist.    Includes links to the following: [facility] (http://wilson-mayo.com/) (only for [facility] and please reserve for uninsured) [facility] (http://blankens[place]-martinez.net/) Psychology Today Special educational needs teacher (https://www.psychologytoday.com/us lendell) Psychology Today Support Group Tax inspector (https://www.psychologytoday.com/us /groups/) [employer] - [employer] Location (https://carolinabehavioralcare.com/staff-location/[place]/) [facility] [place] - Support Group Finder - (RecordDebt.fi) [facility] [employer] - Lexicographer (https://fspcares.org/contact/) [employer] for Mental Health [place] - NAMI (https://namigu[place].org/support-and-education/support-groups/) Interior and spatial designer Health - Affiliated with  [facility] (https://www.[place].com/lb/locations/profile/[facility]-[address]/) Dept of Health and Human Services - Find a mental health facility (http://lester.info/)

## 2024-07-02 NOTE — Addendum Note (Signed)
 Addended by: CARVIN CROCK on: 07/02/2024 02:42 PM   Modules accepted: Level of Service

## 2024-07-10 ENCOUNTER — Ambulatory Visit: Admitting: Family Medicine

## 2024-07-16 ENCOUNTER — Telehealth (HOSPITAL_COMMUNITY): Payer: Self-pay | Admitting: *Deleted

## 2024-07-16 DIAGNOSIS — F411 Generalized anxiety disorder: Secondary | ICD-10-CM

## 2024-07-16 MED ORDER — HYDROXYZINE HCL 25 MG PO TABS: 25.0000 mg | ORAL_TABLET | Freq: Three times a day (TID) | ORAL | 2 refills | Status: DC | PRN

## 2024-07-16 NOTE — Telephone Encounter (Signed)
 Contacted and spoke with patient at [phone] to confirm refill request for atarax . Patient reports benefiting from this medication, denies any side effects.  --Atarax  25 mg TID PRN, 90 tablets, 2 refills   Tesia Lybrand Carrin Carrero, MD PGY-3, [facility]

## 2024-07-16 NOTE — Telephone Encounter (Signed)
 Pt request refill of the Vistaril  (Hydroxyzine ) 25 mg TID PRN. Script was sent in for #30 on 07/02/24. Pt has been taking TID. F/u scheduled for 08/01/24. Please review. Thanks.

## 2024-08-01 ENCOUNTER — Ambulatory Visit (HOSPITAL_COMMUNITY): Admitting: Student in an Organized Health Care Education/Training Program

## 2024-08-01 VITALS — BP 166/71 | HR 67 | Ht 64.0 in | Wt 122.0 lb

## 2024-08-01 DIAGNOSIS — F3281 Premenstrual dysphoric disorder: Secondary | ICD-10-CM | POA: Diagnosis not present

## 2024-08-01 DIAGNOSIS — F411 Generalized anxiety disorder: Secondary | ICD-10-CM

## 2024-08-01 MED ORDER — HYDROXYZINE HCL 10 MG PO TABS: 10.0000 mg | ORAL_TABLET | Freq: Three times a day (TID) | ORAL | 0 refills | Status: DC | PRN

## 2024-08-01 MED ORDER — BUPROPION HCL ER (XL) 150 MG PO TB24: 150.0000 mg | ORAL_TABLET | Freq: Every day | ORAL | 2 refills | Status: DC

## 2024-08-01 NOTE — Progress Notes (Addendum)
 BH MD Outpatient Progress Note  08/02/2024 7:38 PM Brandi G Caiazzo  MRN:  [id number]  Assessment:  Brandi Montgomery presents for follow-up evaluation on 08/02/24 .  Medical student, Brad Prey, was present for the assessment and patient provided verbal consent for the medical student to conduct the interview.  Since the last visit and initiation of atarax  25 mg TID PRN, the patient reported sedating side effects and subsequently decreased the dose to 12.5 mg at night. She has experienced improvement in anxiety and racing thoughts with this regimen.  She has also expressed a preference to retrial Wellbutrin  to address anxiety and depressive symptoms, as she has perceived from this medication in the past.  She was also amenable to start therapy with our clinic.  The patient provided further insight into her past cycles of depression, has identified increased irritability, mood lability, and overall sensation of feeling unwell the week preceding her period and that symptoms fully resolve at the end of her period.  This pattern is suggestive of a depressive disorder with hormonal sensitivity although currently reported pattern of mood changes do not align with what would be expected in classic PMDD. Tracking her menstrual cycle and mood symptoms using a daily calendar or app will help identify patterns and clarify the relationship between hormonal fluctuations and mood changes.   Identifying Information: Brandi DORATHA MCSWAIN is a 33 y.o. female with a history of depression, anxiety, and prior documented diagnosis of bipolar 1 disorder who is an established patient with [facility] for management of depression and anxiety symptoms. Initial evaluation by this provider completed on 07/02/2024. For a comprehensive history and detailed assessment, please refer to the initial adult assessment.  Plan:  # GAD #Depression  #R/o PMDD vs menstrual cycle related mood changes Past medication  trials: Zoloft , Lexapro, Prozac, Celexa, Effexor, trazodone, BuSpar, Wellbutrin  Status of problem: new to me Interventions: -- Decrease atarax  25 mg to 10 mg TID PRN -- Start Wellbutrin  XL 150 mg daily  -- Referral for therapy submitted, Lynwood JONETTA Maris LCSW -- Will provide patient with log at next visit to better track relationship between mood symptoms and menses   Patient was given contact information for behavioral health clinic and was instructed to call 911 for emergencies.   Subjective:  Chief Complaint:  Chief Complaint  Patient presents with   Follow-up    Interval History:  During the patient's previous visit, anxiety was discussed, which they report has improved today. She reports that atarax  has helped with her levels of stress and anxiety, but has noticed that it has been somewhat sedating and makes her groggy during the day. For this reasons, she decreased her atarax  consumption from 3  tablets of atarax  25mg  a day to 1  25 mg tablet at night only. She finds that her levels of stress are still well controlled with this change. She describes that hydroxyzine  is more effective during the week before her period, when she experiences increased irritability, mood lability, decreased concentration, fatigue, and a sensation of feeling unwell. She reports significant improvement in these symptoms with the onset of her period and during menstruation. She specifies that the anxiety that she has a baseline resolves around worry of getting things done and thinking about her busy day filled with tasks. 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 One child is a part of two basketball teams and the other is on a  soccer team. She finds that she is frequently kept busy with this on top of working full time.  She relays that because of the sedating side effects of hydroxyzine , she would prefer to have a regular medication for  anxiety such as Wellbutrin  that she was prescribed in the past. She like Wellbutrin  because it adequately controlled her anxiety and also helped her to focus on tasks that she wants to accomplish. She is also amenable to starting therapy, and was set up with a referral to therapist Lynwood during the appointment.  In regard to sleep, she sleeps on 5-7 hours a night. She feels that this is adequate sleep for her, but notices that she wakes up several times at night usually because she is a light sleeper and says her husband sometimes tosses and turns at night. She was previously taking trazadone and melatonin for sleep, but found both of these made her groggy during the day.  She describes her appetite as sporadic and has an irregular eating schedule mostly because of her busy day. She describes frequently forgetting to eat, or rushing to eat at work between tasks. She says that sometimes after cooking a meal for her family, she loses her appetite. She does not identify a specific reason for losing her appetite, and does not feel that she has fatigue driving a lack of appetite at those times.  In regard to caffeine, she drinks a coffee at 5:30 am and a monster in the afternoon to prevent an afternoon "crash." She has hopes that with Wellbutrin  improving her focus, she will no longer need the monster energy drink in the afternoon. She has no recent substance use, no SI, and no HI.    Visit Diagnosis:    ICD-10-CM   1. GAD (generalized anxiety disorder)  F41.1 hydrOXYzine  (ATARAX ) 10 MG tablet    2. PMDD (premenstrual dysphoric disorder)  F32.81 hydrOXYzine  (ATARAX ) 10 MG tablet    buPROPion  (WELLBUTRIN  XL) 150 MG 24 hr tablet      Past Psychiatric History:  Diagnoses: Bipolar I Disorder (around 2022), anxiety, depression Medication trials: vraylar (stopped due weight gain), abilify (worsened mood), lexapro (denies), prozac (trialed for about 6 months), zoloft  (trialed for about 6 months), trazodone,  effexor (trialed for 6 months), buspar, celexa, wellbutrin  Previous psychiatrist/therapist:  Previously had med management with Dr. Viviane Drone Previous [facility] follow[facility] Denies having any prior follow up with therapist Hospitalizations:Denies, non on chart review Suicide attempts: denies NSSIB Hx: denies Hx of violence towards others: denies Hx of trauma/abuse: denies   Substance Abuse History: Alcohol: no current reported hx of abuse, reprots drinking <1 per month for social reasons             Hx of withdrawal seizures/Dts: denies Tobacco: Denies Cannabis: Denies any current or recent use Other Illicit Substance Use: denies IVDU: denied   Past Medical History PCP: Tower, Laine LABOR, MD  Dx: kidney stones ALL: Drysol Drysol [aluminum chloride]  Head trauma: Denies Seizures: Denies     Family Psychiatric History:  Psychiatric disorders: unknown Suicide hx: deneis Homicide: denies   Social History: Living situation: Lives in [place] with husband and her 2 children (ages 1 and 7) 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 Hx: Denies DUI/DWI: No Military Hx: Denies  Social History   Socioeconomic History   Marital status: Married    Spouse name: Francis   Number of children: 2   Years of education:  Not on file   Highest education level: 12th grade  Occupational History   Not on file  Tobacco Use   Smoking status: Never   Smokeless tobacco: Never  Vaping Use   Vaping status: Never Used  Substance and Sexual Activity   Alcohol use: Yes    Alcohol/week: 0.0 standard drinks of alcohol    Comment: wine, beer, liquor occassionally   Drug use: No   Sexual activity: Yes    Partners: Male    Birth control/protection: Surgical    Comment: vasectomy  Other Topics Concern   Not on file  Social History Narrative   Divorced   High school   Works as a Land         Social Drivers of Health   Financial Resource Strain: Low Risk  (03/25/2024)   Overall Financial Resource Strain (CARDIA)    Difficulty of Paying Living Expenses: Not hard at all  Food Insecurity: No Food Insecurity (03/25/2024)   Hunger Vital Sign    Worried About Running Out of Food in the Last Year: Never true    Ran Out of Food in the Last Year: Never true  Transportation Needs: No Transportation Needs (03/25/2024)   PRAPARE - Administrator, Civil Service (Medical): No    Lack of Transportation (Non-Medical): No  Physical Activity: Insufficiently Active (03/25/2024)   Exercise Vital Sign    Days of Exercise per Week: 1 day    Minutes of Exercise per Session: 20 min  Stress: Stress Concern Present (03/25/2024)   [employer] of Occupational Health - Occupational Stress Questionnaire    Feeling of Stress : Very much  Social Connections: Moderately Isolated (03/25/2024)   Social Connection and Isolation Panel    Frequency of Communication with Friends and Family: Three times a week    Frequency of Social Gatherings with Friends and Family: Once a week    Attends Religious Services: Never    Database administrator or Organizations: No    Attends Engineer, structural: Not on file    Marital Status: Married    Allergies:  Allergies  Allergen Reactions   Drysol [Aluminum Chloride] Hives    Current Medications: Current Outpatient Medications  Medication Sig Dispense Refill   buPROPion  (WELLBUTRIN  XL) 150 MG 24 hr tablet Take 1 tablet (150 mg total) by mouth daily. 30 tablet 2   hydrOXYzine  (ATARAX ) 10 MG tablet Take 1 tablet (10 mg total) by mouth 3 (three) times daily as needed. 30 tablet 0   Multiple Vitamin (MULTIVITAMINS PO) Take by mouth.     Nutritional Supplements (VITAMIN D  MAINTENANCE PO) Take by mouth.     No current facility-administered medications for this visit.    ROS: Review of Systems  All other systems reviewed and are  negative.   Objective:  Objective: Psychiatric Specialty Exam: General Appearance: Casual, well groomed  Eye Contact:  Good    Speech:  Clear, coherent, normal rate, spontaneous  Volume:  Normal   Mood:  see above  Affect:  Appropriate, congruent, full range  Thought Content: Logical  Suicidal Thoughts: see subjective  Thought Process:  Coherent, goal-directed, circumstantial   Orientation:  A&Ox4   Memory:  Immediate good  Judgment:  Fair   Insight:  Fair  Concentration:  Attention and concentration good   Recall:  Good  Fund of Knowledge: Good  Language: Good, fluent  Psychomotor Activity: Normal  Akathisia:  NA   AIMS (if  indicated): NA   Assets:   Communication Skills Social Support  ADL's:  Intact  Cognition: WNL  Sleep: see above  Appetite: see above    Physical Exam Vitals reviewed.  HENT:     Head: Normocephalic and atraumatic.  Eyes:     Extraocular Movements: Extraocular movements intact.     Conjunctiva/sclera: Conjunctivae normal.  Pulmonary:     Effort: Pulmonary effort is normal. No respiratory distress.  Skin:    General: Skin is warm and dry.  Neurological:     General: No focal deficit present.      Metabolic Disorder Labs: No results found for: HGBA1C, MPG No results found for: PROLACTIN Lab Results  Component Value Date   CHOL 190 03/31/2023   TRIG 110.0 03/31/2023   HDL 57.10 03/31/2023   CHOLHDL 3 03/31/2023   VLDL 22.0 03/31/2023   LDLCALC 111 (H) 03/31/2023   LDLCALC 118 (H) 11/10/2021   Lab Results  Component Value Date   TSH 1.48 03/31/2023   TSH 1.33 11/10/2021    Therapeutic Level Labs: No results found for: LITHIUM No results found for: VALPROATE No results found for: CBMZ  Screenings:  GAD-7    Flowsheet Row [facility] Office Visit from 03/26/2024 in [facility] [place] Office Visit from 03[facility]31/2023 in [facility] Visit from 12/24/2021 in [facility] [employer] Visit from 02/05/2021 in [facility]  Total GAD-7 Score 8 7 0 21 18   PHQ2-9    Flowsheet Row Office Visit from 07/02/2024 in [facility] Office Visit from 03/26/2024 in [facility] Office Visit from 03[facility]31/2023 in [facility] Visit from 12/24/2021 in [facility] Visit from 11/10/2021 in [facility]  PHQ-2 Total Score 3 2 1 2  0  PHQ-9 Total Score 10 8 2 6  --   Flowsheet Row ED from 03/20/2024 in [facility] Emergency Department at [facility] ED from 05/29/2022 in [facility] Department at [facility]  C-SSRS RISK CATEGORY No Risk No Risk    Collaboration of Care:   Patient/Guardian was advised Release of Information must be obtained prior to any record release in order to collaborate their care with an outside provider. Patient/Guardian was advised if they have not already done so to contact the registration department to sign all necessary forms in order for us  to release information regarding their care.   Consent: Patient/Guardian gives verbal consent for treatment and assignment of benefits for services provided during this visit. Patient/Guardian expressed understanding and agreed to proceed.    Brad Prey MS3  I personally was present and performed or re-performed the history, physical exam and medical decision-making activities of this service and have verified that the service and findings are accurately documented in the student's note.   Marlo Masson, MD 08/02/2024, 7:38 PM

## 2024-08-02 ENCOUNTER — Encounter (HOSPITAL_COMMUNITY): Payer: Self-pay | Admitting: Student in an Organized Health Care Education/Training Program

## 2024-08-07 ENCOUNTER — Ambulatory Visit (INDEPENDENT_AMBULATORY_CARE_PROVIDER_SITE_OTHER): Payer: Self-pay | Admitting: Licensed Clinical Social Worker

## 2024-08-07 ENCOUNTER — Encounter (HOSPITAL_COMMUNITY): Payer: Self-pay

## 2024-08-07 DIAGNOSIS — Z91199 Patient's noncompliance with other medical treatment and regimen due to unspecified reason: Secondary | ICD-10-CM

## 2024-08-07 NOTE — Progress Notes (Signed)
 THERAPIST PROGRESS NOTE   Session Date: 08/07/2024  Session Time: 1500  Brandi Montgomery    Clinician attempted to connect with patient for scheduled appointment via Caregility video, sending text request x3 with no response.     Attempt 1: Text: 1510   Attempt 2: Text: 1511    Attempt 3: Text: 1512   Disconnected video visit at:  1513     Per [facility] policy, after multiple attempts to reach patient unsuccessfully at appointed time, visit will be coded as a no show.  Brandi Montgomery, MSW, LCSW 08/07/2024,  3:14 PM

## 2024-08-09 ENCOUNTER — Telehealth: Payer: Self-pay | Admitting: Family Medicine

## 2024-08-09 NOTE — Telephone Encounter (Addendum)
 We have tried to email but will not go through. I have called office to verify email address but was on hold for over 15 minutes. I will try to fax from our office to [phone] now. Holding to call back today to make sure received.    I have called and left message for Brandi Montgomery to call office so I can update him on where we are with this referral.

## 2024-08-09 NOTE — Telephone Encounter (Addendum)
 I was able to get to go with right email. I will call back tomorrow to get set up.

## 2024-08-09 NOTE — Telephone Encounter (Signed)
 Copied from CRM #[id number]. Topic: Referral - Question >> Aug 09, 2024  9:13 AM Robinson H wrote: Reason for CRM: Patients husband is calling upset regarding referral to Endo, states they've been getting the runarounds about the referral and [facility] is still saying they don't have a referral for patient. Francis states he was told to have office call them. Was asking to speak with the office manager to look into referral and why it hasn't reached [facility], advised patients husband per system referral was sent.  Francis [phone]

## 2024-08-09 NOTE — Telephone Encounter (Signed)
 I tried several times to email this referral to Darice with [facility] and the email keeps returning postmaster error.   I did put SECURE in the subject line.     At this point, I requested that Joellen print and fax the documents requested. I also refaxed the information again via Electronic faxing.   Also as stated to Joellen earlier, emailing referrals and patient's private health information is not part of our normal protocol and is not something that we do often, in fact its quite rare. I did honor the request this time and attempt to email the referral, which failed to be delivered. In instances like this where the electronic faxing and ROI faxing fail I usually give the option of the office printing and faxing the referral OR the patient picking up a printed copy of records.   If you have any further questions or concerns please let me know. Thanks!

## 2024-08-09 NOTE — Telephone Encounter (Signed)
Thanks for working on this.

## 2024-08-09 NOTE — Telephone Encounter (Signed)
 Called office back after 2 attempts on hold for over 15 min each. I was able to reach office. Verified the email. We were sending to wrong one. Has been emailed to right address. Right email is [email or web address].

## 2024-08-09 NOTE — Telephone Encounter (Signed)
 I have called office referral not received. Requested that we send to secured email but I am not sure if we can or how to send. Let me know what I need to do to help.   E mail: [email or web address]

## 2024-08-13 NOTE — Telephone Encounter (Signed)
 Called office verified referral has been received and is in process of review from provider. Will reach out to patient for appointment.   Have called Francis [phone] and left message to call office to update on progress.

## 2024-08-14 ENCOUNTER — Encounter (HOSPITAL_COMMUNITY): Payer: Self-pay | Admitting: Student in an Organized Health Care Education/Training Program

## 2024-08-14 NOTE — Telephone Encounter (Unsigned)
 Copied from CRM [id number]. Topic: General - Other >> Aug 14, 2024 11:47 AM Mesmerise C wrote: Reason for CRM: Patient's husband Francis returning call for office manager Gennesis Hogland called CAL stated she's with a patient he would like a call back at [phone]

## 2024-08-22 ENCOUNTER — Other Ambulatory Visit: Payer: Self-pay | Admitting: Medical Genetics

## 2024-08-22 DIAGNOSIS — Z006 Encounter for examination for normal comparison and control in clinical research program: Secondary | ICD-10-CM

## 2024-09-09 NOTE — Progress Notes (Unsigned)
 BH MD Outpatient Progress Note  09/10/2024 5:19 PM Brandi Montgomery  MRN:  [id number]  Assessment:  Brandi Montgomery presents for follow-up evaluation on 09/10/24 .    At today's follow-up, the patient remains stable with no recurrence of mood or anxiety symptoms. PHQ-9 and GAD-7 scores are both 0, and she reports continued benefit from Wellbutrin  without side effects. Wellbutrin  and atarax  are appropriate to continue at their current dose. Patient prefers to establish therapy with a female provider; resources were provided.   The patient reported she is to have follow-up with endocrinology for ongoing symptoms of fatigue (unable to see this appointment on chart review). Fatigue at this time do not appear to be significantly impacting her psychiatric symptoms.    Identifying Information: Brandi Montgomery is a 33 y.o. female with a history of depression, anxiety, and prior documented diagnosis of bipolar 1 disorder who is an established patient with [facility] for management of depression and anxiety symptoms. Initial evaluation by this provider completed on 07/02/2024. For a comprehensive history and detailed assessment, please refer to the initial adult assessment.  Plan:  # GAD #Depression  #R/o PMDD vs menstrual cycle related mood changes Past medication trials: Zoloft , Lexapro, Prozac, Celexa, Effexor, trazodone, BuSpar, Wellbutrin  Status of problem: new to me Interventions: -- Continue atarax  10 mg nightly  -- Continue Wellbutrin  XL 150 mg daily  -- Resend referral for therapist, patient prefers female provider -- Will provide patient with log at next visit to better track relationship between mood symptoms and menses   Patient was given contact information for behavioral health clinic and was instructed to call 911 for emergencies.   Subjective:  Chief Complaint:  Chief Complaint  Patient presents with   Follow-up    Interval History:   Patient  reports she has felt more productive, which has improved her mood. She attributes this improvement to Wellbutrin . She describes her depression and anxiety as better and improved from prior. She denies adverse effects to medications and states she is taking her medications as directed without missing doses. She reports good sleep and appetite. She denies recent substance use, suicidal ideation, homicidal ideation, and auditory or visual hallucinations.   Visit Diagnosis:    ICD-10-CM   1. GAD (generalized anxiety disorder)  F41.1 hydrOXYzine  (ATARAX ) 10 MG tablet    2. PMDD (premenstrual dysphoric disorder)  F32.81 hydrOXYzine  (ATARAX ) 10 MG tablet    buPROPion  (WELLBUTRIN  XL) 150 MG 24 hr tablet       Past Psychiatric History:  Diagnoses: Bipolar I Disorder (around 2022), anxiety, depression Medication trials: vraylar (stopped due weight gain), abilify (worsened mood), lexapro (denies), prozac (trialed for about 6 months), zoloft  (trialed for about 6 months), trazodone, effexor (trialed for 6 months), buspar, celexa, wellbutrin  Previous psychiatrist/therapist:  Previously had med management with Dr. Viviane Drone Previous psychiatric follow-[facility] Denies having any prior follow up with therapist Hospitalizations:Denies, non on chart review Suicide attempts: denies NSSIB Hx: denies Hx of violence towards others: denies Hx of trauma/abuse: denies   Substance Abuse History: Alcohol: no current reported hx of abuse, reprots drinking <1 per month for social reasons             Hx of withdrawal seizures/Dts: denies Tobacco: Denies Cannabis: Denies any current or recent use Other Illicit Substance Use: denies IVDU: denied   Past Medical History PCP: Tower, Laine LABOR, MD  Dx: kidney stones ALL: Drysol Drysol [aluminum chloride]  Head  trauma: Denies Seizures: Denies     Family Psychiatric History:  Psychiatric disorders: unknown Suicide hx:  deneis Homicide: denies   Social History: Living situation: Lives in [place] with husband and her 2 children (ages 57 and 7) Occupational status: Cytogeneticist history: HS Marital Status: Married Armed [employer] Operational Officer Hx: Denies DUI/DWI: No Hotel Manager Hx: Denies  Social History   Socioeconomic History   Marital status: Married    Spouse name: Francis   Number of children: 2   Years of education: Not on file   Highest education level: 12th grade  Occupational History   Not on file  Tobacco Use   Smoking status: Never   Smokeless tobacco: Never  Vaping Use   Vaping status: Never Used  Substance and Sexual Activity   Alcohol use: Yes    Alcohol/week: 0.0 standard drinks of alcohol    Comment: wine, beer, liquor occassionally   Drug use: No   Sexual activity: Yes    Partners: Male    Birth control/protection: Surgical    Comment: vasectomy  Other Topics Concern   Not on file  Social History Narrative   Divorced   High school   Works as a Economist         Social Drivers of Health   Financial Resource Strain: Low Risk  (03/25/2024)   Overall Financial Resource Strain (CARDIA)    Difficulty of Paying Living Expenses: Not hard at all  Food Insecurity: No Food Insecurity (03/25/2024)   Hunger Vital Sign    Worried About Running Out of Food in the Last Year: Never true    Ran Out of Food in the Last Year: Never true  Transportation Needs: No Transportation Needs (03/25/2024)   PRAPARE - Administrator, Civil Service (Medical): No    Lack of Transportation (Non-Medical): No  Physical Activity: Insufficiently Active (03/25/2024)   Exercise Vital Sign    Days of Exercise per Week: 1 day    Minutes of Exercise per Session: 20 min  Stress: Stress Concern Present (03/25/2024)   [employer] of Occupational Health - Occupational Stress Questionnaire    Feeling of Stress : Very much  Social Connections: Moderately Isolated (03/25/2024)   Social  Connection and Isolation Panel    Frequency of Communication with Friends and Family: Three times a week    Frequency of Social Gatherings with Friends and Family: Once a week    Attends Religious Services: Never    Database Administrator or Organizations: No    Attends Engineer, Structural: Not on file    Marital Status: Married    Allergies:  Allergies  Allergen Reactions   Drysol [Aluminum Chloride] Hives    Current Medications: Current Outpatient Medications  Medication Sig Dispense Refill   buPROPion  (WELLBUTRIN  XL) 150 MG 24 hr tablet Take 1 tablet (150 mg total) by mouth daily. 90 tablet 0   hydrOXYzine  (ATARAX ) 10 MG tablet Take 1 tablet (10 mg total) by mouth at bedtime as needed for anxiety. 90 tablet 0   Multiple Vitamin (MULTIVITAMINS PO) Take by mouth.     Nutritional Supplements (VITAMIN D  MAINTENANCE PO) Take by mouth.     No current facility-administered medications for this visit.    ROS: Review of Systems  All other systems reviewed and are negative.   Objective:  Objective: Psychiatric Specialty Exam: General Appearance: Casual, well groomed  Eye Contact:  Good    Speech:  Clear, coherent, normal rate, spontaneous  Volume:  Normal   Mood:  see above  Affect:  Appropriate, congruent, full range  Thought Content: Logical  Suicidal Thoughts: see subjective  Thought Process:  Coherent, goal-directed, circumstantial   Orientation:  A&Ox4   Memory:  Immediate good  Judgment:  Fair  Insight:  Fair  Concentration:  Attention and concentration good   Recall:  Good  Fund of Knowledge: Good  Language: Good, fluent  Psychomotor Activity: Normal  Akathisia:  NA   AIMS (if indicated): NA   Assets:   Communication Skills Social Support  ADL's:  Intact  Cognition: WNL  Sleep: see above  Appetite: see above    Physical Exam Vitals reviewed.  HENT:     Head: Normocephalic and atraumatic.  Eyes:     Extraocular Movements: Extraocular  movements intact.     Conjunctiva/sclera: Conjunctivae normal.  Pulmonary:     Effort: Pulmonary effort is normal. No respiratory distress.  Skin:    General: Skin is warm and dry.  Neurological:     General: No focal deficit present.      Metabolic Disorder Labs: No results found for: HGBA1C, MPG No results found for: PROLACTIN Lab Results  Component Value Date   CHOL 190 03/31/2023   TRIG 110.0 03/31/2023   HDL 57.10 03/31/2023   CHOLHDL 3 03/31/2023   VLDL 22.0 03/31/2023   LDLCALC 111 (H) 03/31/2023   LDLCALC 118 (H) 11/10/2021   Lab Results  Component Value Date   TSH 1.48 03/31/2023   TSH 1.33 11/10/2021    Therapeutic Level Labs: No results found for: LITHIUM No results found for: VALPROATE No results found for: CBMZ  Screenings:  GAD-7    Flowsheet Row [facility] Office Visit from 07/02/2024 in [facility] Office Visit from 03[facility]26/2024 in [facility] Office Visit from 03/31/2023 in [facility] Visit from 12/24/2021 in [facility] [place] [facility] [employer] [facility]  Total GAD-7 Score 0 8 7 0 21   PHQ2-9    Flowsheet Row Office Visit from 09/10/2024 in [facility] Office Visit from 07/02/2024 in [facility] Office Visit from 03/26/2024 in [facility] [place] [facility] Office Visit from 03/31/2023 in [facility] Visit from 12/24/2021 in [facility] Total Score 0 3 2 1 2   PHQ-9 Total Score -- 10 8 2 6    Flowsheet Row ED from 03/20/2024 in [facility] Emergency Department at [facility] ED from 05/29/2022 in [facility] Emergency Department at [facility]  C-SSRS RISK  CATEGORY No Risk No Risk    Collaboration of Care:   Patient/Guardian was advised Release of Information must be obtained prior to any record release in order to collaborate their care with an outside provider. Patient/Guardian was advised if they have not already done so to contact the registration department to sign all necessary forms in order for us  to release information regarding their care.   Consent: Patient/Guardian gives verbal consent for treatment and assignment of benefits for services provided during this visit. Patient/Guardian expressed understanding and agreed to proceed.    Marlo Masson, MD 09/10/2024, 5:19 PM

## 2024-09-10 ENCOUNTER — Encounter (HOSPITAL_COMMUNITY): Payer: Self-pay | Admitting: Student in an Organized Health Care Education/Training Program

## 2024-09-10 ENCOUNTER — Ambulatory Visit (HOSPITAL_COMMUNITY): Admitting: Student in an Organized Health Care Education/Training Program

## 2024-09-10 DIAGNOSIS — F411 Generalized anxiety disorder: Secondary | ICD-10-CM | POA: Diagnosis not present

## 2024-09-10 DIAGNOSIS — F3281 Premenstrual dysphoric disorder: Secondary | ICD-10-CM

## 2024-09-10 MED ORDER — HYDROXYZINE HCL 10 MG PO TABS: 10.0000 mg | ORAL_TABLET | Freq: Every evening | ORAL | 0 refills | Status: DC | PRN

## 2024-09-10 MED ORDER — BUPROPION HCL ER (XL) 150 MG PO TB24: 150.0000 mg | ORAL_TABLET | Freq: Every day | ORAL | 0 refills | Status: DC

## 2024-11-01 ENCOUNTER — Encounter: Payer: Self-pay | Admitting: Family Medicine

## 2024-11-01 ENCOUNTER — Ambulatory Visit (INDEPENDENT_AMBULATORY_CARE_PROVIDER_SITE_OTHER): Admitting: Family Medicine

## 2024-11-01 VITALS — BP 112/70 | HR 97 | Temp 98.3°F | Ht 64.0 in | Wt 121.5 lb

## 2024-11-01 DIAGNOSIS — N946 Dysmenorrhea, unspecified: Secondary | ICD-10-CM | POA: Insufficient documentation

## 2024-11-01 DIAGNOSIS — F3281 Premenstrual dysphoric disorder: Secondary | ICD-10-CM | POA: Insufficient documentation

## 2024-11-01 DIAGNOSIS — N920 Excessive and frequent menstruation with regular cycle: Secondary | ICD-10-CM | POA: Insufficient documentation

## 2024-11-01 MED ORDER — DROSPIRENONE-ETHINYL ESTRADIOL 3-0.03 MG PO TABS: 1.0000 | ORAL_TABLET | Freq: Every day | ORAL | 5 refills | Status: AC

## 2024-11-01 NOTE — Progress Notes (Signed)
 "  Subjective:    Patient ID: Brandi Montgomery, female    DOB: [birth year], 34 y.o.   MRN: [id number]  HPI  Wt Readings from Last 3 Encounters:  11/01/24 121 lb 8 oz (55.1 kg)  03/26/24 124 lb 6 oz (56.4 kg)  03/20/24 125 lb (56.7 kg)   20.86 kg/m  Vitals:   11/01/24 0933  BP: 112/70  Pulse: 97  Temp: 98.3 F (36.8 C)  SpO2: 100%    Pt presents to discuss  Menstrual problems PMDD   Has moderate PMDD  Also very heavy menses Last 5 days  Also painful for first few days   Mood changes - irritable /anx /dep for week before menses  Lasts about 3-4 days into period  It really causes problems  Her sweating also tracks with this  On wellbutrin   Does not tolerate ssri   No other gyn history   In past - depo provera  shot - after son was born / only did it twice  Husb had vasectomy  Pap 01/2021  Nl with neg HPV and neg std screen  Dr Leonce left- and will need new gyn   Had work up with endocrinology for sweating No reason found    Patient Active Problem List   Diagnosis Date Noted   Heavy menses 11/01/2024   Menses painful 11/01/2024   PMDD (premenstrual dysphoric disorder) 11/01/2024   Vitamin D  deficiency 03/26/2024   Ankle swelling, left 07/20/2023   Preventative health care 03/31/2023   FH: breast cancer 12/24/2021   Anxiety and depression 12/24/2021   Bipolar 1 disorder (HCC) 11/11/2021   Excessive sweating 11/11/2021   Gastroesophageal reflux disease 11/11/2021   History of cholecystectomy 11/10/2021   Kidney stones 11/10/2021   Biliary colic 11/12/2018   Past Medical History:  Diagnosis Date   Anxiety Jan 2022   Chronic kidney disease    Depression Jan 2022   Past Surgical History:  Procedure Laterality Date   ANKLE SURGERY Left 2005   CHOLECYSTECTOMY N/A 11/13/2018   Procedure: LAPAROSCOPIC CHOLECYSTECTOMY;  Surgeon: Tye Millet, DO;  Location: [facility] ORS;  Service: General;  Laterality: N/A;   Social History[1] Family History  Problem  Relation Age of Onset   Cancer Mother        breast cancer   Stroke Mother 30       Blood clot related to diet pills.   Hypertension Mother    Cancer Maternal Grandmother 61       Breast cancer   Diabetes Maternal Grandmother    Hypertension Maternal Grandmother    Allergies[2] Medications Ordered Prior to Encounter[3]  Review of Systems  Constitutional:  Negative for activity change, appetite change, fatigue, fever and unexpected weight change.  HENT:  Negative for congestion, ear pain, rhinorrhea, sinus pressure and sore throat.   Eyes:  Negative for pain, redness and visual disturbance.  Respiratory:  Negative for cough, shortness of breath and wheezing.   Cardiovascular:  Negative for chest pain and palpitations.  Gastrointestinal:  Negative for abdominal pain, blood in stool, constipation and diarrhea.  Endocrine: Negative for polydipsia and polyuria.  Genitourinary:  Negative for dysuria, frequency and urgency.  Musculoskeletal:  Negative for arthralgias, back pain and myalgias.  Skin:  Negative for pallor and rash.  Allergic/Immunologic: Negative for environmental allergies.  Neurological:  Negative for dizziness, syncope and headaches.  Hematological:  Negative for adenopathy. Does not bruise/bleed easily.  Psychiatric/Behavioral:  Negative for decreased concentration and dysphoric mood. The patient is not  nervous/anxious.        Objective:   Physical Exam Constitutional:      General: She is not in acute distress.    Appearance: Normal appearance. She is well-developed and normal weight. She is not ill-appearing or diaphoretic.  HENT:     Head: Normocephalic and atraumatic.  Eyes:     Conjunctiva/sclera: Conjunctivae normal.     Pupils: Pupils are equal, round, and reactive to light.  Neck:     Thyroid: No thyromegaly.     Vascular: No carotid bruit or JVD.  Cardiovascular:     Rate and Rhythm: Normal rate and regular rhythm.     Heart sounds: Normal heart  sounds.     No gallop.  Pulmonary:     Effort: Pulmonary effort is normal. No respiratory distress.     Breath sounds: Normal breath sounds. No wheezing or rales.  Abdominal:     General: There is no distension or abdominal bruit.     Palpations: Abdomen is soft.  Musculoskeletal:     Cervical back: Normal range of motion and neck supple.     Right lower leg: No edema.     Left lower leg: No edema.  Lymphadenopathy:     Cervical: No cervical adenopathy.  Skin:    General: Skin is warm and dry.     Coloration: Skin is not pale.     Findings: No rash.  Neurological:     Mental Status: She is alert.     Coordination: Coordination normal.     Deep Tendon Reflexes: Reflexes are normal and symmetric. Reflexes normal.  Psychiatric:        Mood and Affect: Mood normal.           Assessment & Plan:   Problem List Items Addressed This Visit       Genitourinary   Menses painful   Trial of OC         Other   PMDD (premenstrual dysphoric disorder)   Trial of monophasic OC  Will call if not improved in 3 mo   Consider prn ssri / but pt has not done well with ssri in past       Heavy menses - Primary   Heavy and painful menses Also PMDD Interested in OC  Discussed options   Will try generic yaz  Long discussion re: way to take OC properly and avoidance of smoking  Risks of blood clots outlined as well as possible side eff Pt aware that this does not prevent stds and condoms should still be used inst that it may take up to 3 months for menses to fall into rhythm or side eff to stop  Adv to call if problems or questions   Handouts given             [1]  Social History Tobacco Use   Smoking status: Never   Smokeless tobacco: Never  Vaping Use   Vaping status: Never Used  Substance Use Topics   Alcohol use: Yes    Alcohol/week: 0.0 standard drinks of alcohol    Comment: wine, beer, liquor occassionally   Drug use: No  [2]  Allergies Allergen Reactions    Drysol [Aluminum Chloride] Hives  [3]  Current Outpatient Medications on File Prior to Visit  Medication Sig Dispense Refill   buPROPion  (WELLBUTRIN  XL) 150 MG 24 hr tablet Take 1 tablet (150 mg total) by mouth daily. 90 tablet 0   Ferrous Sulfate (IRON PO) Take  1 tablet by mouth daily at 12 noon. BLOOD BUILDER: IRON SUPPLEMENT     hydrOXYzine  (ATARAX ) 10 MG tablet Take 1 tablet (10 mg total) by mouth at bedtime as needed for anxiety. 90 tablet 0   Multiple Vitamin (MULTIVITAMINS PO) Take by mouth.     Nutritional Supplements (VITAMIN D  MAINTENANCE PO) Take by mouth.     No current facility-administered medications on file prior to visit.   "

## 2024-11-01 NOTE — Assessment & Plan Note (Signed)
 Trial of OC

## 2024-11-01 NOTE — Assessment & Plan Note (Signed)
 Heavy and painful menses Also PMDD Interested in OC  Discussed options   Will try generic yaz  Long discussion re: way to take OC properly and avoidance of smoking  Risks of blood clots outlined as well as possible side eff Pt aware that this does not prevent stds and condoms should still be used inst that it may take up to 3 months for menses to fall into rhythm or side eff to stop  Adv to call if problems or questions   Handouts given

## 2024-11-01 NOTE — Assessment & Plan Note (Addendum)
 Trial of monophasic OC  Will call if not improved in 3 mo   Consider prn ssri / but pt has not done well with ssri in past  Also in setting of bipolar disorder

## 2024-11-01 NOTE — Patient Instructions (Signed)
 Take the oral contraceptive daily at the same time   Start the Sunday after your period starts (if your period starts on a Sunday then start that day)   Don't smoke  If needed - condoms for STD prevention   If any intolerable side effects -call  If not improved in 3 months let us  know

## 2024-11-04 NOTE — Progress Notes (Unsigned)
 BH MD Outpatient Progress Note  11/05/2024 5:49 PM Adrian G Whitacre  MRN:  [id number]  Virtual Visit via Telephone Note  I connected with Brandi Montgomery on 11/05/2024 at  2:30 PM EST by a video enabled telemedicine application and verified that I am speaking with the correct person using two identifiers.  Location: Patient: Home Provider: Office   I discussed the limitations, risks, security and privacy concerns of performing an evaluation and management service by telephone and the availability of in person appointments. I also discussed with the patient that there may be a patient responsible charge related to this service. The patient expressed understanding and agreed to proceed.   I discussed the assessment and treatment plan with the patient. The patient was provided an opportunity to ask questions and all were answered. The patient agreed with the plan and demonstrated an understanding of the instructions.   The patient was advised to call back or seek an in-person evaluation if the symptoms worsen or if the condition fails to improve as anticipated.   Marlo Masson, MD Psych Resident, PGY-3    Assessment:  Brandi Montgomery presents for follow-up evaluation on 11/05/2024 .    At todays follow-up, the patient reports worsening depressive symptoms and reduced sleep quality since the last visit. She notes that Wellbutrin  is less effective at the current dose, with lower energy and mood benefit, and that she is sleeping fewer hours with poor restorative sleep. Hydroxyzine  10 mg is no longer effective for sleep. Given partial response and emerging tolerance, Wellbutrin  was increased from 150 mg to 300 mg daily, which is appropriate to address persistent depressive symptoms. Vistaril  was increased from 10 mg to 25 mg at bedtime, which is reasonable for short-term sleep support. Remeron  was discussed but deferred due to concern for drug interaction risk and patient preference after  follow-up phone discussion. At the next visit, we will review sleep hygiene strategies and reassess sleep; will reconsider alternative agents if insomnia persists despite current adjustments.   Identifying Information: Brandi Montgomery is a 34 y.o. female with a history of depression, anxiety, and prior documented diagnosis of bipolar 1 disorder who is an established patient with [facility] for management of depression and anxiety symptoms. Initial evaluation by this provider completed on 07/02/2024. For a comprehensive history and detailed assessment, please refer to the initial adult assessment.  Plan:  # GAD #Depression  # Insomnia #R/o PMDD vs menstrual cycle related mood changes Past medication trials: Zoloft , Lexapro, Prozac, Celexa, Effexor, trazodone, BuSpar, Wellbutrin  Status of problem: Worsened from prior Interventions: -- Increase Atarax  from 10 mg nightly to 25 mg nightly -- Increase Wellbutrin  XL 150 mg daily to 300 mg daily     Patient was given contact information for behavioral health clinic and was instructed to call 911 for emergencies.   Subjective:  Chief Complaint:  Chief Complaint  Patient presents with   Follow-up    Interval History:  Since the last visit, patient reports her depressive symptoms are unchanged, stable, and that she is doing well overall. She describes no symptoms of anxiety and states she is not currently experiencing suicidal ideation, homicidal ideation, or auditory or visual hallucinations. Patient reports poor sleep characterized by difficulty falling asleep and staying asleep, averaging approximately five hours per night with associated daytime fatigue. She describes her appetite as normal and unchanged. Patient reports adherence to her prescribed medications and denies any adverse effects. She describes no current participation in therapy. Patient  reports no recent substance use, including alcohol, tobacco, or  cannabis.   Visit Diagnosis:    ICD-10-CM   1. Insomnia, unspecified type  G47.00 hydrOXYzine  (ATARAX ) 25 MG tablet    DISCONTINUED: mirtazapine  (REMERON ) 7.5 MG tablet    2. PMDD (premenstrual dysphoric disorder)  F32.81 buPROPion  (WELLBUTRIN  XL) 300 MG 24 hr tablet    hydrOXYzine  (ATARAX ) 25 MG tablet    DISCONTINUED: buPROPion  (WELLBUTRIN  XL) 150 MG 24 hr tablet    3. GAD (generalized anxiety disorder)  F41.1 hydrOXYzine  (ATARAX ) 25 MG tablet       Past Psychiatric History:  Diagnoses: Bipolar I Disorder (around 2022), anxiety, depression Medication trials: vraylar (stopped due weight gain), abilify (worsened mood), lexapro (denies), prozac (trialed for about 6 months), zoloft  (trialed for about 6 months), trazodone, effexor (trialed for 6 months), buspar, celexa, wellbutrin  Previous psychiatrist/therapist:  Previously had med management with Dr. Viviane Drone Previous psychiatric follow-up with [facility] Denies having any prior follow up with therapist Hospitalizations:Denies, non on chart review Suicide attempts: denies NSSIB Hx: denies Hx of violence towards others: denies Hx of trauma/abuse: denies   Substance Abuse History: Alcohol: no current reported hx of abuse, reprots drinking <1 per month for social reasons             Hx of withdrawal seizures/Dts: denies Tobacco: Denies Cannabis: Denies any current or recent use Other Illicit Substance Use: denies IVDU: denied   Past Medical History PCP: [facility], Laine LABOR, MD  Dx: kidney stones ALL: Drysol Drysol [aluminum chloride]  Head trauma: Denies Seizures: Denies     Family Psychiatric History:  Psychiatric disorders: unknown Suicide hx: deneis Homicide: denies   Social History: Living situation: Lives in [place] with husband and her 2 children (ages 72 and 7) Occupational status: Cytogeneticist history: HS Marital Status: Married Armed [employer] Operational Officer Hx: Denies DUI/DWI:  No Hotel Manager Hx: Denies  Social History   Socioeconomic History   Marital status: Married    Spouse name: Francis   Number of children: 2   Years of education: Not on file   Highest education level: 12th grade  Occupational History   Not on file  Tobacco Use   Smoking status: Never   Smokeless tobacco: Never  Vaping Use   Vaping status: Never Used  Substance and Sexual Activity   Alcohol use: Yes    Alcohol/week: 0.0 standard drinks of alcohol    Comment: wine, beer, liquor occassionally   Drug use: No   Sexual activity: Yes    Partners: Male    Birth control/protection: Surgical    Comment: vasectomy  Other Topics Concern   Not on file  Social History Narrative   Divorced   High school   Works as a Economist         Social Drivers of Health   Tobacco Use: Low Risk (11/01/2024)   Patient History    Smoking Tobacco Use: Never    Smokeless Tobacco Use: Never    Passive Exposure: Not on file  Financial Resource Strain: Low Risk (10/31/2024)   Overall Financial Resource Strain (CARDIA)    Difficulty of Paying Living Expenses: Not hard at all  Food Insecurity: No Food Insecurity (10/31/2024)   Epic    Worried About Radiation Protection Practitioner of Food in the Last Year: Never true    Ran Out of Food in the Last Year: Never true  Transportation Needs: No Transportation Needs (10/31/2024)   Epic    Lack  of Transportation (Medical): No    Lack of Transportation (Non-Medical): No  Physical Activity: Insufficiently Active (10/31/2024)   Exercise Vital Sign    Days of Exercise per Week: 3 days    Minutes of Exercise per Session: 20 min  Stress: Stress Concern Present (10/31/2024)   [employer] of Occupational Health - Occupational Stress Questionnaire    Feeling of Stress: To some extent  Social Connections: Socially Isolated (10/31/2024)   Social Connection and Isolation Panel    Frequency of Communication with Friends and Family: Once a week    Frequency of Social Gatherings with  Friends and Family: Never    Attends Religious Services: Never    Database Administrator or Organizations: No    Attends Engineer, Structural: Not on file    Marital Status: Married  Depression (PHQ2-9): Low Risk (09/10/2024)   Depression (PHQ2-9)    PHQ-2 Score: 0  Recent Concern: Depression (PHQ2-9) - Medium Risk (07/02/2024)   Depression (PHQ2-9)    PHQ-2 Score: 10  Alcohol Screen: Low Risk (03/25/2024)   Alcohol Screen    Last Alcohol Screening Score (AUDIT): 0  Housing: Unknown (10/31/2024)   Epic    Unable to Pay for Housing in the Last Year: No    Number of Times Moved in the Last Year: Not on file    Homeless in the Last Year: No  Utilities: Not At Risk (09/14/2024)   [facility] from [facility]   Epic    In the past 12 months has the electric, gas, oil, or water company threatened to shut off services in your home?: No  Health Literacy: Not on file    Allergies:  Allergies  Allergen Reactions   Drysol [Aluminum Chloride] Hives    Current Medications: Current Outpatient Medications  Medication Sig Dispense Refill   buPROPion  (WELLBUTRIN  XL) 150 MG 24 hr tablet Take 1 tablet (150 mg total) by mouth daily. 14 tablet 0   drospirenone -ethinyl estradiol  (YASMIN  28) 3-0.03 MG tablet Take 1 tablet by mouth daily. 28 tablet 5   Ferrous Sulfate (IRON PO) Take 1 tablet by mouth daily at 12 noon. BLOOD BUILDER: IRON SUPPLEMENT     hydrOXYzine  (ATARAX ) 10 MG tablet Take 1 tablet (10 mg total) by mouth at bedtime as needed for anxiety. 90 tablet 0   Multiple Vitamin (MULTIVITAMINS PO) Take by mouth.     Nutritional Supplements (VITAMIN D  MAINTENANCE PO) Take by mouth.     No current facility-administered medications for this visit.    ROS: Review of Systems  All other systems reviewed and are negative.   Objective:  Objective: Psychiatric Specialty Exam: General Appearance: Casual, well groomed  Eye Contact:  Good    Speech:  Clear, coherent,  normal rate, spontaneous  Volume:  Normal   Mood:  see above  Affect:  Appropriate, congruent,  Thought Content: Logical  Suicidal Thoughts: see subjective  Thought Process:  Coherent, goal-directed  Orientation:  A&Ox4   Memory:  Immediate good  Judgment:  Fair  Insight:  Fair  Concentration:  Attention and concentration good   Recall:  Good  Fund of Knowledge: Good  Language: Good, fluent  Psychomotor Activity: Normal  Akathisia:  NA   AIMS (if indicated): NA   Assets:   Communication Skills Social Support  ADL's:  Intact  Cognition: WNL  Sleep: see above  Appetite: see above    Physical Exam Vitals reviewed.  HENT:     Head: Normocephalic and  atraumatic.  Eyes:     Extraocular Movements: Extraocular movements intact.     Conjunctiva/sclera: Conjunctivae normal.  Pulmonary:     Effort: Pulmonary effort is normal. No respiratory distress.  Skin:    General: Skin is warm and dry.  Neurological:     General: No focal deficit present.      Metabolic Disorder Labs: No results found for: HGBA1C, MPG No results found for: PROLACTIN Lab Results  Component Value Date   CHOL 190 03/31/2023   TRIG 110.0 03/31/2023   HDL 57.10 03/31/2023   CHOLHDL 3 03/31/2023   VLDL 22.0 03/31/2023   LDLCALC 111 (H) 03/31/2023   LDLCALC 118 (H) 11/10/2021   Lab Results  Component Value Date   TSH 1.48 03/31/2023   TSH 1.33 11/10/2021    Therapeutic Level Labs: No results found for: LITHIUM No results found for: VALPROATE No results found for: CBMZ  Screenings:  GAD-7    Flowsheet Row [facility] Visit from [facility]/20[facility] in [facility] Office Visit from 07/02/2024 in [facility] Office Visit from 03[facility]26/2024 in [facility] [place] Office Visit from 03/31/2023 in [facility] Visit from 12/24/2021 in [facility]  Total GAD-7 Score 0 8 7 0 21   PHQ2-9    Flowsheet Row Office Visit from 09/10/2024 in [facility] Office Visit from 07/02/2024 in [facility] Office Visit from 03[facility]26/2024 in [facility] Office Visit from 03/31/2023 in [facility] Visit from 12/24/2021 in [facility] Total Score 0 3 2 1 2   PHQ-9 Total Score -- 10 8 2 6    Flowsheet Row ED from 03/20/2024 in [facility] at [facility] ED from 05/29/2022 in [facility] Department at [facility]  C-SSRS RISK CATEGORY No Risk No Risk    Collaboration of Care:   Patient/Guardian was advised Release of Information must be obtained prior to any record release in order to collaborate their care with an outside provider. Patient/Guardian was advised if they have not already done so to contact the registration department to sign all necessary forms in order for us  to release information regarding their care.   Consent: Patient/Guardian gives verbal consent for treatment and assignment of benefits for services provided during this visit. Patient/Guardian expressed understanding and agreed to proceed.    Marlo Masson, MD 11/05/2024, 5:49 PM

## 2024-11-05 ENCOUNTER — Telehealth (HOSPITAL_COMMUNITY): Admitting: Student in an Organized Health Care Education/Training Program

## 2024-11-05 DIAGNOSIS — F411 Generalized anxiety disorder: Secondary | ICD-10-CM | POA: Diagnosis not present

## 2024-11-05 DIAGNOSIS — G4700 Insomnia, unspecified: Secondary | ICD-10-CM | POA: Diagnosis not present

## 2024-11-05 DIAGNOSIS — F3281 Premenstrual dysphoric disorder: Secondary | ICD-10-CM

## 2024-11-05 MED ORDER — MIRTAZAPINE 7.5 MG PO TABS: 7.5000 mg | ORAL_TABLET | Freq: Every day | ORAL | 2 refills | Status: DC

## 2024-11-05 MED ORDER — BUPROPION HCL ER (XL) 150 MG PO TB24: 150.0000 mg | ORAL_TABLET | Freq: Every day | ORAL | 0 refills | Status: DC

## 2024-11-06 MED ORDER — HYDROXYZINE HCL 25 MG PO TABS: 25.0000 mg | ORAL_TABLET | Freq: Every evening | ORAL | 0 refills | Status: AC | PRN

## 2024-11-06 MED ORDER — BUPROPION HCL ER (XL) 300 MG PO TB24: 300.0000 mg | ORAL_TABLET | Freq: Every day | ORAL | 0 refills | Status: AC

## 2024-11-06 NOTE — Addendum Note (Signed)
 Addended by: CARVIN CROCK on: 11/06/2024 09:27 AM   Modules accepted: Level of Service

## 2024-11-29 ENCOUNTER — Ambulatory Visit: Admitting: Family Medicine

## 2024-11-29 VITALS — BP 120/84 | HR 75 | Temp 98.3°F | Ht 64.0 in | Wt 117.2 lb

## 2024-11-29 DIAGNOSIS — R509 Fever, unspecified: Secondary | ICD-10-CM | POA: Insufficient documentation

## 2024-11-29 LAB — POCT INFLUENZA A/B
Influenza A, POC: NEGATIVE
Influenza B, POC: NEGATIVE

## 2024-11-29 LAB — POCT RAPID STREP A (OFFICE): Rapid Strep A Screen: NEGATIVE

## 2024-11-29 LAB — POC COVID19 BINAXNOW: SARS Coronavirus 2 Ag: NEGATIVE

## 2024-11-29 NOTE — Assessment & Plan Note (Signed)
"   Acute,  most likely viral upper respiratory tract infecitons.  Neg COVID, flu and strep.  Encouraged rest, fluids and symptomatic care.   Er and return precautions provided. "

## 2024-11-29 NOTE — Progress Notes (Signed)
 "   Patient ID: Brandi Montgomery, female    DOB: 10-Sep-1991, 34 y.o.   MRN: [id number]  This visit was conducted in person.  BP 120/84 (BP Location: Right Arm, Cuff Size: Normal)   Pulse 75   Temp 98.3 F (36.8 C) (Oral)   Ht 5' 4 (1.626 m)   Wt 117 lb 3.2 oz (53.2 kg)   LMP 10/15/2024   SpO2 95%   BMI 20.12 kg/m    CC:  Chief Complaint  Patient presents with   Acute Visit    Sore throat, nausua, cough, fever, lethargic,  Onset - 10/27/24    Subjective:   HPI: Brandi Montgomery is a 34 y.o. female presenting on 11/29/2024 for Acute Visit (Sore throat, nausua, cough, fever, lethargic, /Onset - 10/27/24)  PCP [facility]  Date of onset:  2 days  Initial symptoms included sore throat, headache Symptoms progressed to nausea, cough ( productive mucus) and fever 100 F Fatigue. No body aches.  Husband saw white exudate in back of throat.   Sick contacts:  flu.. multiple family members COVID testing:   none    She has tried to treat with  tylenol , ibuprofen .     No history of chronic lung disease such as asthma or COPD. Non-smoker.   Cough not keeping up at night.     Relevant past medical, surgical, family and social history reviewed and updated as indicated. Interim medical history since our last visit reviewed. Allergies and medications reviewed and updated. Outpatient Medications Prior to Visit  Medication Sig Dispense Refill   buPROPion  (WELLBUTRIN  XL) 300 MG 24 hr tablet Take 1 tablet (300 mg total) by mouth daily. 90 tablet 0   drospirenone -ethinyl estradiol  (YASMIN  28) 3-0.03 MG tablet Take 1 tablet by mouth daily. 28 tablet 5   Ferrous Sulfate (IRON PO) Take 1 tablet by mouth daily at 12 noon. BLOOD BUILDER: IRON SUPPLEMENT     hydrOXYzine  (ATARAX ) 25 MG tablet Take 1 tablet (25 mg total) by mouth at bedtime as needed for anxiety. 90 tablet 0   Multiple Vitamin (MULTIVITAMINS PO) Take by mouth.     Nutritional Supplements (VITAMIN D  MAINTENANCE PO) Take by mouth.     No  facility-administered medications prior to visit.     Per HPI unless specifically indicated in ROS section below Review of Systems  Constitutional:  Positive for fever. Negative for fatigue.  HENT:  Positive for congestion. Negative for ear pain.   Eyes:  Negative for pain.  Respiratory:  Positive for cough. Negative for chest tightness and shortness of breath.   Cardiovascular:  Negative for chest pain, palpitations and leg swelling.  Gastrointestinal:  Negative for abdominal pain.  Genitourinary:  Negative for dysuria.   Objective:  BP 120/84 (BP Location: Right Arm, Cuff Size: Normal)   Pulse 75   Temp 98.3 F (36.8 C) (Oral)   Ht 5' 4 (1.626 m)   Wt 117 lb 3.2 oz (53.2 kg)   LMP 10/15/2024   SpO2 95%   BMI 20.12 kg/m   Wt Readings from Last 3 Encounters:  11/29/24 117 lb 3.2 oz (53.2 kg)  11/01/24 121 lb 8 oz (55.1 kg)  03/26/24 124 lb 6 oz (56.4 kg)      Physical Exam Constitutional:      General: She is not in acute distress.    Appearance: She is well-developed. She is not ill-appearing or toxic-appearing.  HENT:     Head: Normocephalic.     Right  Ear: Hearing, ear canal and external ear normal. A middle ear effusion is present. Tympanic membrane is not erythematous, retracted or bulging.     Left Ear: Hearing, tympanic membrane, ear canal and external ear normal.  No middle ear effusion. Tympanic membrane is not erythematous, retracted or bulging.     Nose: Mucosal edema and rhinorrhea present.     Right Sinus: No maxillary sinus tenderness or frontal sinus tenderness.     Left Sinus: No maxillary sinus tenderness or frontal sinus tenderness.     Mouth/Throat:     Pharynx: Uvula midline.  Eyes:     General: Lids are normal. Lids are everted, no foreign bodies appreciated.     Conjunctiva/sclera: Conjunctivae normal.     Pupils: Pupils are equal, round, and reactive to light.  Neck:     Thyroid: No thyroid mass or thyromegaly.     Vascular: No carotid bruit.      Trachea: Trachea normal.  Cardiovascular:     Rate and Rhythm: Normal rate and regular rhythm.     Pulses: Normal pulses.     Heart sounds: Normal heart sounds, S1 normal and S2 normal. No murmur heard.    No friction rub. No gallop.  Pulmonary:     Effort: Pulmonary effort is normal. No tachypnea or respiratory distress.     Breath sounds: Normal breath sounds. No decreased breath sounds, wheezing, rhonchi or rales.  Musculoskeletal:     Cervical back: Normal range of motion and neck supple.  Skin:    General: Skin is warm and dry.     Findings: No rash.  Neurological:     Mental Status: She is alert.  Psychiatric:        Mood and Affect: Mood is not anxious or depressed.        Speech: Speech normal.        Behavior: Behavior normal. Behavior is cooperative.        Judgment: Judgment normal.       Results for orders placed or performed in visit on 11/29/24  POC COVID-19 BinaxNow   Collection Time: 11/29/24  2:39 PM  Result Value Ref Range   SARS Coronavirus 2 Ag Negative Negative  POCT rapid strep A   Collection Time: 11/29/24  2:39 PM  Result Value Ref Range   Rapid Strep A Screen Negative Negative  POCT Influenza A/B   Collection Time: 11/29/24  2:40 PM  Result Value Ref Range   Influenza A, POC Negative Negative   Influenza B, POC Negative Negative    Assessment and Plan  Fever, unspecified fever cause Assessment & Plan:  Acute,  most likely viral upper respiratory tract infecitons.  Neg COVID, flu and strep.  Encouraged rest, fluids and symptomatic care.   Er and return precautions provided.  Orders: -     POC COVID-19 BinaxNow -     POCT Influenza A/B -     POCT rapid strep A    No follow-ups on file.   Greig Ring, MD  "

## 2024-12-10 ENCOUNTER — Ambulatory Visit (HOSPITAL_COMMUNITY): Admitting: Student in an Organized Health Care Education/Training Program
